# Patient Record
Sex: Female | Born: 1970 | Race: White | Hispanic: No | Marital: Married | Smoking: Current every day smoker
Health system: Southern US, Community
[De-identification: ages and names within clinical notes are randomized; demographics above are authoritative.]

## PROBLEM LIST (undated history)

## (undated) DIAGNOSIS — Z720 Tobacco use: Secondary | ICD-10-CM

## (undated) DIAGNOSIS — F419 Anxiety disorder, unspecified: Secondary | ICD-10-CM

## (undated) DIAGNOSIS — E119 Type 2 diabetes mellitus without complications: Secondary | ICD-10-CM

## (undated) DIAGNOSIS — E7800 Pure hypercholesterolemia, unspecified: Secondary | ICD-10-CM

## (undated) DIAGNOSIS — E039 Hypothyroidism, unspecified: Secondary | ICD-10-CM

## (undated) DIAGNOSIS — K5792 Diverticulitis of intestine, part unspecified, without perforation or abscess without bleeding: Secondary | ICD-10-CM

## (undated) DIAGNOSIS — K219 Gastro-esophageal reflux disease without esophagitis: Secondary | ICD-10-CM

## (undated) HISTORY — DX: Pure hypercholesterolemia, unspecified: E78.00

## (undated) HISTORY — DX: Diverticulitis of intestine, part unspecified, without perforation or abscess without bleeding: K57.92

---

## 2005-04-08 ENCOUNTER — Emergency Department (HOSPITAL_COMMUNITY): Admission: EM | Admit: 2005-04-08 | Discharge: 2005-04-08 | Payer: Self-pay | Admitting: Emergency Medicine

## 2005-07-21 ENCOUNTER — Emergency Department (HOSPITAL_COMMUNITY): Admission: EM | Admit: 2005-07-21 | Discharge: 2005-07-21 | Payer: Self-pay | Admitting: Emergency Medicine

## 2006-02-18 HISTORY — PX: CHOLECYSTECTOMY: SHX55

## 2008-01-07 ENCOUNTER — Emergency Department (HOSPITAL_COMMUNITY): Admission: EM | Admit: 2008-01-07 | Discharge: 2008-01-07 | Payer: Self-pay | Admitting: Emergency Medicine

## 2010-11-21 LAB — URINALYSIS, ROUTINE W REFLEX MICROSCOPIC
Ketones, ur: NEGATIVE
Nitrite: NEGATIVE
Protein, ur: NEGATIVE
pH: 5.5

## 2011-02-26 ENCOUNTER — Encounter: Payer: Self-pay | Admitting: *Deleted

## 2011-02-26 ENCOUNTER — Emergency Department (HOSPITAL_COMMUNITY)
Admission: EM | Admit: 2011-02-26 | Discharge: 2011-02-26 | Disposition: A | Discharge status: Home / Self Care | Payer: BC Managed Care – PPO | Attending: Emergency Medicine | Admitting: Emergency Medicine

## 2011-02-26 ENCOUNTER — Emergency Department (HOSPITAL_COMMUNITY): Payer: BC Managed Care – PPO

## 2011-02-26 DIAGNOSIS — H109 Unspecified conjunctivitis: Secondary | ICD-10-CM | POA: Insufficient documentation

## 2011-02-26 DIAGNOSIS — J40 Bronchitis, not specified as acute or chronic: Secondary | ICD-10-CM | POA: Insufficient documentation

## 2011-02-26 DIAGNOSIS — J9801 Acute bronchospasm: Secondary | ICD-10-CM

## 2011-02-26 DIAGNOSIS — F17200 Nicotine dependence, unspecified, uncomplicated: Secondary | ICD-10-CM | POA: Insufficient documentation

## 2011-02-26 MED ORDER — PREDNISONE 20 MG PO TABS
60.0000 mg | ORAL_TABLET | Freq: Once | ORAL | Status: AC
  Administered 2011-02-26: 60 mg via ORAL
  Filled 2011-02-26: qty 3

## 2011-02-26 MED ORDER — PREDNISONE 10 MG PO TABS: 20.0000 mg | ORAL_TABLET | Freq: Two times a day (BID) | ORAL | Status: DC

## 2011-02-26 MED ORDER — TOBRAMYCIN 0.3 % OP SOLN: 1.0000 [drp] | OPHTHALMIC | Status: AC

## 2011-02-26 MED ORDER — AZITHROMYCIN 250 MG PO TABS: 250.0000 mg | ORAL_TABLET | Freq: Every day | ORAL | Status: AC

## 2011-02-26 MED ORDER — ALBUTEROL SULFATE HFA 108 (90 BASE) MCG/ACT IN AERS
2.0000 | INHALATION_SPRAY | RESPIRATORY_TRACT | Status: DC | PRN
  Administered 2011-02-26: 2 via RESPIRATORY_TRACT
  Filled 2011-02-26: qty 6.7

## 2011-02-26 MED ORDER — AZITHROMYCIN 250 MG PO TABS
500.0000 mg | ORAL_TABLET | Freq: Once | ORAL | Status: AC
  Administered 2011-02-26: 500 mg via ORAL
  Filled 2011-02-26: qty 2

## 2011-02-26 NOTE — ED Notes (Signed)
Pt a/ox4. Resp even and unlabored. NAD at this time. D/C instructions reviewed with pt. Pt verbalized understanding. Pt ambulated to lobby with steady gate.  

## 2011-02-26 NOTE — ED Notes (Signed)
Cough, congestion for 1 week.  

## 2011-02-27 NOTE — ED Provider Notes (Signed)
History     CSN: [id number]  Arrival date & time 02/26/11  1910   First MD Initiated Contact with Patient 02/26/11 2057      Chief Complaint  Patient presents with  . Shortness of Breath    (Consider location/radiation/quality/duration/timing/severity/associated sxs/prior treatment) Patient is a 41 y.o. female presenting with shortness of breath. The history is provided by the patient (pt complians of cough). No language interpreter was used.  Shortness of Breath  The current episode started yesterday. The problem occurs rarely. The problem has been unchanged. The problem is mild. The symptoms are relieved by nothing. The symptoms are aggravated by nothing. Associated symptoms include chest pressure, orthopnea, cough and shortness of breath. Pertinent negatives include no chest pain. The cough has no precipitants. The cough is non-productive. Nothing relieves the cough. Nothing worsens the cough. Her past medical history does not include asthma.    History reviewed. No pertinent past medical history.  Past Surgical History  Procedure Date  . Cholecystectomy     History reviewed. No pertinent family history.  History  Substance Use Topics  . Smoking status: Current Everyday Smoker  . Smokeless tobacco: Not on file  . Alcohol Use: Yes    OB History    Grav Para Term Preterm Abortions TAB SAB Ect Mult Living                  Review of Systems  Constitutional: Negative for fatigue.  HENT: Negative for congestion, sinus pressure and ear discharge.   Eyes: Negative for discharge.  Respiratory: Positive for cough and shortness of breath.   Cardiovascular: Positive for orthopnea. Negative for chest pain.  Gastrointestinal: Negative for abdominal pain and diarrhea.  Genitourinary: Negative for frequency and hematuria.  Musculoskeletal: Negative for back pain.  Skin: Negative for rash.  Neurological: Negative for seizures and headaches.  Hematological: Negative.     Psychiatric/Behavioral: Negative for hallucinations.    Allergies  Review of patient's allergies indicates no known allergies.  Home Medications   Current Outpatient Rx  Name Route Sig Dispense Refill  . ALPRAZOLAM 1 MG PO TABS Oral Take 1 mg by mouth daily as needed. FOR ANXIETY     . GOODY HEADACHE PO Oral Take 1 packet by mouth as needed. For pain     . CLEAR EYES REDNESS RELIEF OP Ophthalmic Apply 1-2 drops to eye daily as needed. Redness in eye     . PHENYLEPHRINE-DM-GG 2.5-5-100 MG/5ML PO LIQD Oral Take 15 mLs by mouth every 4 (four) hours as needed. For congestion/wheezing     . AZITHROMYCIN 250 MG PO TABS Oral Take 1 tablet (250 mg total) by mouth daily. 4 tablet 0  . PREDNISONE 10 MG PO TABS Oral Take 2 tablets (20 mg total) by mouth 2 (two) times daily. 10 tablet 0  . TOBRAMYCIN SULFATE 0.3 % OP SOLN Right Eye Place 1 drop into the right eye every 4 (four) hours. 5 mL 0    BP 136/77  Pulse 89  Temp(Src) 98.5 F (36.9 C) (Oral)  Resp 20  Ht 5\' 8"  (1.727 m)  Wt 250 lb (113.399 kg)  BMI 38.01 kg/m2  SpO2 95%  LMP 02/07/2011  Physical Exam  Constitutional: She is oriented to person, place, and time. She appears well-developed.  HENT:  Head: Normocephalic and atraumatic.  Eyes: EOM are normal. No scleral icterus.       Right eye with small infection medially  Neck: Neck supple. No thyromegaly present.  Cardiovascular:  Normal rate and regular rhythm.  Exam reveals no gallop and no friction rub.   No murmur heard. Pulmonary/Chest: No stridor. She has wheezes. She has no rales. She exhibits no tenderness.  Abdominal: She exhibits no distension. There is no tenderness. There is no rebound.  Musculoskeletal: Normal range of motion. She exhibits no edema.  Lymphadenopathy:    She has no cervical adenopathy.  Neurological: She is oriented to person, place, and time. Coordination normal.  Skin: No rash noted. No erythema.  Psychiatric: She has a normal mood and affect.  Her behavior is normal.    ED Course  Procedures (including critical care time)  Labs Reviewed - No data to display Dg Chest 2 View  02/26/2011  *RADIOLOGY REPORT*  Clinical Data: Cough, short of breath, wheeze  CHEST - 2 VIEW  Comparison: None  Findings: Normal cardiac silhouette.  There is chronic bronchitic markings centrally.  No focal consolidation.  No pleural fluid. No acute osseous abnormality.  IMPRESSION: Chronic appearing bronchitic markings.  Cannot exclude  bronchitis. No evidence of pneumonia.  Original Report Authenticated By: Genevive Bi, M.D.     1. Bronchitis   2. Bronchospasm   3. Conjunctivitis       MDM          Benny Lennert, MD 02/27/11 1732

## 2012-12-02 ENCOUNTER — Inpatient Hospital Stay (HOSPITAL_COMMUNITY)
Admission: AD | Admit: 2012-12-02 | Discharge: 2012-12-07 | DRG: 392 | Disposition: A | Discharge status: Home / Self Care | Payer: BC Managed Care – PPO | Source: Ambulatory Visit | Attending: Family Medicine | Admitting: Family Medicine

## 2012-12-02 ENCOUNTER — Emergency Department (HOSPITAL_COMMUNITY)
Admission: EM | Admit: 2012-12-02 | Discharge: 2012-12-02 | Disposition: A | Discharge status: Home / Self Care | Payer: BC Managed Care – PPO | Source: Home / Self Care | Attending: Emergency Medicine | Admitting: Emergency Medicine

## 2012-12-02 ENCOUNTER — Encounter (HOSPITAL_COMMUNITY): Payer: Self-pay

## 2012-12-02 ENCOUNTER — Emergency Department (HOSPITAL_COMMUNITY): Payer: BC Managed Care – PPO

## 2012-12-02 ENCOUNTER — Encounter (HOSPITAL_COMMUNITY): Payer: Self-pay | Admitting: Emergency Medicine

## 2012-12-02 DIAGNOSIS — E039 Hypothyroidism, unspecified: Secondary | ICD-10-CM | POA: Diagnosis present

## 2012-12-02 DIAGNOSIS — K5720 Diverticulitis of large intestine with perforation and abscess without bleeding: Secondary | ICD-10-CM | POA: Diagnosis present

## 2012-12-02 DIAGNOSIS — E876 Hypokalemia: Secondary | ICD-10-CM

## 2012-12-02 DIAGNOSIS — D72829 Elevated white blood cell count, unspecified: Secondary | ICD-10-CM | POA: Diagnosis present

## 2012-12-02 DIAGNOSIS — K5732 Diverticulitis of large intestine without perforation or abscess without bleeding: Principal | ICD-10-CM

## 2012-12-02 DIAGNOSIS — E079 Disorder of thyroid, unspecified: Secondary | ICD-10-CM

## 2012-12-02 DIAGNOSIS — A419 Sepsis, unspecified organism: Secondary | ICD-10-CM | POA: Diagnosis present

## 2012-12-02 DIAGNOSIS — F17200 Nicotine dependence, unspecified, uncomplicated: Secondary | ICD-10-CM

## 2012-12-02 DIAGNOSIS — K5792 Diverticulitis of intestine, part unspecified, without perforation or abscess without bleeding: Secondary | ICD-10-CM | POA: Diagnosis present

## 2012-12-02 DIAGNOSIS — Z6838 Body mass index (BMI) 38.0-38.9, adult: Secondary | ICD-10-CM

## 2012-12-02 DIAGNOSIS — F419 Anxiety disorder, unspecified: Secondary | ICD-10-CM

## 2012-12-02 DIAGNOSIS — K5900 Constipation, unspecified: Secondary | ICD-10-CM | POA: Diagnosis present

## 2012-12-02 DIAGNOSIS — E669 Obesity, unspecified: Secondary | ICD-10-CM

## 2012-12-02 DIAGNOSIS — R739 Hyperglycemia, unspecified: Secondary | ICD-10-CM

## 2012-12-02 DIAGNOSIS — E871 Hypo-osmolality and hyponatremia: Secondary | ICD-10-CM | POA: Diagnosis present

## 2012-12-02 DIAGNOSIS — Z720 Tobacco use: Secondary | ICD-10-CM

## 2012-12-02 DIAGNOSIS — F411 Generalized anxiety disorder: Secondary | ICD-10-CM | POA: Diagnosis present

## 2012-12-02 DIAGNOSIS — R109 Unspecified abdominal pain: Secondary | ICD-10-CM

## 2012-12-02 HISTORY — DX: Tobacco use: Z72.0

## 2012-12-02 HISTORY — DX: Anxiety disorder, unspecified: F41.9

## 2012-12-02 HISTORY — DX: Hypothyroidism, unspecified: E03.9

## 2012-12-02 LAB — CBC WITH DIFFERENTIAL/PLATELET
Basophils Relative: 0 % (ref 0–1)
Eosinophils Absolute: 0.1 10*3/uL (ref 0.0–0.7)
Eosinophils Relative: 0 % (ref 0–5)
MCHC: 34.1 g/dL (ref 30.0–36.0)
MCV: 90.7 fL (ref 78.0–100.0)
Monocytes Absolute: 0.9 10*3/uL (ref 0.1–1.0)
Monocytes Relative: 6 % (ref 3–12)
Neutro Abs: 13.4 10*3/uL — ABNORMAL HIGH (ref 1.7–7.7)
Neutrophils Relative %: 88 % — ABNORMAL HIGH (ref 43–77)
Platelets: 204 10*3/uL (ref 150–400)
RBC: 4.52 MIL/uL (ref 3.87–5.11)
RDW: 13.8 % (ref 11.5–15.5)
WBC: 15.3 10*3/uL — ABNORMAL HIGH (ref 4.0–10.5)

## 2012-12-02 LAB — TSH: TSH: 11.47 u[IU]/mL — ABNORMAL HIGH (ref 0.350–4.500)

## 2012-12-02 LAB — PREGNANCY, URINE: Preg Test, Ur: NEGATIVE

## 2012-12-02 LAB — URINALYSIS, ROUTINE W REFLEX MICROSCOPIC
Hgb urine dipstick: NEGATIVE
Ketones, ur: NEGATIVE mg/dL
Nitrite: NEGATIVE
Specific Gravity, Urine: 1.02 (ref 1.005–1.030)
pH: 7 (ref 5.0–8.0)

## 2012-12-02 LAB — COMPREHENSIVE METABOLIC PANEL
AST: 15 U/L (ref 0–37)
Chloride: 100 mEq/L (ref 96–112)
Creatinine, Ser: 0.73 mg/dL (ref 0.50–1.10)
GFR calc Af Amer: >90 mL/min (ref >90)
GFR calc non Af Amer: >90 mL/min (ref >90)
Sodium: 134 mEq/L — ABNORMAL LOW (ref 135–145)
Total Bilirubin: 1 mg/dL (ref 0.3–1.2)

## 2012-12-02 LAB — PROTIME-INR
INR: 1.02 (ref 0.00–1.49)
Prothrombin Time: 13.2 seconds (ref 11.6–15.2)

## 2012-12-02 LAB — LIPASE, BLOOD: Lipase: 18 U/L (ref 11–59)

## 2012-12-02 LAB — HEMOGLOBIN A1C: Mean Plasma Glucose: 137 mg/dL — ABNORMAL HIGH (ref <117)

## 2012-12-02 MED ORDER — MORPHINE SULFATE 4 MG/ML IJ SOLN
4.0000 mg | Freq: Once | INTRAMUSCULAR | Status: AC
  Administered 2012-12-02: 4 mg via INTRAVENOUS
  Filled 2012-12-02: qty 1

## 2012-12-02 MED ORDER — ACETAMINOPHEN 650 MG RE SUPP: 650.0000 mg | Freq: Four times a day (QID) | RECTAL | Status: DC | PRN

## 2012-12-02 MED ORDER — ENOXAPARIN SODIUM 40 MG/0.4ML ~~LOC~~ SOLN
40.0000 mg | Freq: Every day | SUBCUTANEOUS | Status: DC
  Administered 2012-12-02 – 2012-12-06 (×5): 40 mg via SUBCUTANEOUS
  Filled 2012-12-02 (×5): qty 0.4

## 2012-12-02 MED ORDER — NICOTINE 21 MG/24HR TD PT24
21.0000 mg | MEDICATED_PATCH | Freq: Every day | TRANSDERMAL | Status: DC
  Administered 2012-12-02 – 2012-12-06 (×5): 21 mg via TRANSDERMAL
  Filled 2012-12-02 (×4): qty 1

## 2012-12-02 MED ORDER — ONDANSETRON HCL 4 MG PO TABS: 4.0000 mg | ORAL_TABLET | Freq: Four times a day (QID) | ORAL | Status: DC | PRN

## 2012-12-02 MED ORDER — SODIUM CHLORIDE 0.9 % IJ SOLN
INTRAMUSCULAR | Status: AC
  Administered 2012-12-02: 10 mL
  Filled 2012-12-02: qty 400

## 2012-12-02 MED ORDER — POTASSIUM CHLORIDE IN NACL 20-0.9 MEQ/L-% IV SOLN
INTRAVENOUS | Status: DC
  Administered 2012-12-02 – 2012-12-07 (×8): via INTRAVENOUS

## 2012-12-02 MED ORDER — METRONIDAZOLE IN NACL 5-0.79 MG/ML-% IV SOLN
500.0000 mg | Freq: Three times a day (TID) | INTRAVENOUS | Status: DC
  Administered 2012-12-02 – 2012-12-07 (×15): 500 mg via INTRAVENOUS
  Filled 2012-12-02 (×16): qty 100

## 2012-12-02 MED ORDER — ACETAMINOPHEN 325 MG PO TABS
650.0000 mg | ORAL_TABLET | Freq: Four times a day (QID) | ORAL | Status: DC | PRN
  Administered 2012-12-02: 650 mg via ORAL
  Filled 2012-12-02: qty 2

## 2012-12-02 MED ORDER — IOHEXOL 300 MG/ML  SOLN
50.0000 mL | Freq: Once | INTRAMUSCULAR | Status: AC | PRN
  Administered 2012-12-02: 50 mL via ORAL

## 2012-12-02 MED ORDER — ONDANSETRON HCL 4 MG/2ML IJ SOLN: 4.0000 mg | Freq: Four times a day (QID) | INTRAMUSCULAR | Status: DC | PRN

## 2012-12-02 MED ORDER — HYDROMORPHONE HCL PF 1 MG/ML IJ SOLN
1.0000 mg | INTRAMUSCULAR | Status: DC | PRN
  Administered 2012-12-02 – 2012-12-06 (×18): 1 mg via INTRAVENOUS
  Filled 2012-12-02 (×18): qty 1

## 2012-12-02 MED ORDER — HYDROCODONE-ACETAMINOPHEN 5-325 MG PO TABS: 1.0000 | ORAL_TABLET | Freq: Four times a day (QID) | ORAL | Status: DC | PRN

## 2012-12-02 MED ORDER — CIPROFLOXACIN IN D5W 400 MG/200ML IV SOLN
400.0000 mg | Freq: Two times a day (BID) | INTRAVENOUS | Status: DC
  Administered 2012-12-02 – 2012-12-06 (×10): 400 mg via INTRAVENOUS
  Filled 2012-12-02 (×11): qty 200

## 2012-12-02 MED ORDER — LORAZEPAM 2 MG/ML IJ SOLN
0.5000 mg | Freq: Four times a day (QID) | INTRAMUSCULAR | Status: DC | PRN
  Administered 2012-12-03 – 2012-12-06 (×7): 0.5 mg via INTRAVENOUS
  Filled 2012-12-02 (×7): qty 1

## 2012-12-02 MED ORDER — LEVOTHYROXINE SODIUM 100 MCG IV SOLR
44.0000 ug | Freq: Every day | INTRAVENOUS | Status: DC
  Filled 2012-12-02 (×2): qty 5

## 2012-12-02 MED ORDER — IOHEXOL 300 MG/ML  SOLN
100.0000 mL | Freq: Once | INTRAMUSCULAR | Status: AC | PRN
  Administered 2012-12-02: 100 mL via INTRAVENOUS

## 2012-12-02 NOTE — ED Notes (Signed)
Pt c/o pain in the lower abdomen x 3 days with N/V. Reports dysuria and constipation with last normal BM on Monday night.

## 2012-12-02 NOTE — Progress Notes (Signed)
Dr. Sherrie Mustache and Toya Smothers, NP notified of temp 102.4F New orders for blood cultures x2.

## 2012-12-02 NOTE — H&P (Signed)
[facility] History and Physical  ALYHA MARINES XBJ:[id number] DOB: 01/14/1971 DOA: 12/02/2012  Referring physician:  PCP: Milana Obey, MD  Specialists:   Chief Complaint:   HPI: [name] is a very pleasant 42 y.o. female past medical history that includes thyroid disease (hypothyroidism), anxiety, borderline impaired glucose tolerance presents to room 301 from her PCPs office this morning with the chief complaint of abdominal pain. Information is obtained from the patient and her husband who is at the bedside. She reports that 3 days ago she developed sudden sharp pain located in her suprapubic area of her abdomen. The pain radiates to the left lower quadrant greater than the right lower quadrant.. She states that she had a bowel movement that evening and has not had a significant bowel movement since then. She has continued to work and attempted to manage her pain with ibuprofen with little relief. Her symptoms came on suddenly and have persisted. The pain is characterized as moderate. Associated symptoms include nausea, vomiting and anorexia. She denies any coffee ground emesis and states emesis is mostly clear liquid. She also reports some burning with urination at the very beginning of flow. She denies any fever , chills, or recent sick contacts. She came to the emergency department very early this morning with the same complaint and was discharged home with pain medicine and instructions to followup with her PCP next week. When she got home it was quickly obvious to her that the pain was worsening as was the nausea. She went to Dr. Michelle Nasuti office this morning. Following his evaluation, she was referred for direct hospital admission. Initial workup while in the emergency department earlier this morning included a CT of the abdomen/pelvis which yielded acute diverticulitis of the mid sigmoid colon immediately above the bladder with a micro perforation. Lab work at that time was  significant for sodium of 134 , potassium 3.4, white count of 15.3 and glucose of 133.  [facility] were asked to admit   Review of Systems:  10 point review of systems completed all systems are negative except as indicated in the history of present illness  Past Medical History  Diagnosis Date  . Anxiety   . Hypothyroidism    Past Surgical History  Procedure Laterality Date  . Cholecystectomy     Social History:  reports that she has been smoking.  She does not have any smokeless tobacco history on file. She reports that she drinks alcohol. She reports that she does not use illicit drugs. She is married and lives with her husband she is employed full-time with [employer]. She smokes approximately a pack of cigarettes a day and has done so for the last 17 years. She denies EtOH or illicit drug use.  No Known Allergies  Family Hx:  mother 87 years old has a medical history that includes hypertension and GERD. Father is 83 years old, he has had prostate cancer and a history of diverticulitis. She has 2 siblings their collective medical history is positive for prostate cancer.   Prior to Admission medications   Medication Sig Start Date End Date Taking? Authorizing Provider  ALPRAZolam Prudy Feeler) 1 MG tablet Take 1 mg by mouth daily as needed. FOR ANXIETY    Yes Historical Provider, MD  levothyroxine (SYNTHROID, LEVOTHROID) 88 MCG tablet Take 88 mcg by mouth daily before breakfast.   Yes Historical Provider, MD  Aspirin-Acetaminophen-Caffeine (GOODY HEADACHE PO) Take 1 packet by mouth as needed. For pain     Historical  Provider, MD  HYDROcodone-acetaminophen (NORCO/VICODIN) 5-325 MG per tablet Take 1 tablet by mouth every 6 (six) hours as needed for pain. 12/02/12   Benny Lennert, MD  Naphazoline-Glycerin (CLEAR EYES REDNESS RELIEF OP) Apply 1-2 drops to eye daily as needed. Redness in eye     Historical Provider, MD  Phenylephrine-DM-GG (MUCINEX FAST-MAX CONGEST COUGH) 2.5-5-100 MG/5ML LIQD  Take 15 mLs by mouth every 4 (four) hours as needed. For congestion/wheezing     Historical Provider, MD  predniSONE (DELTASONE) 10 MG tablet Take 2 tablets (20 mg total) by mouth 2 (two) times daily. 02/26/11   Benny Lennert, MD   Physical Exam: Filed Vitals:   12/02/12 1309  BP: 100/51  Pulse: 88  Temp: 98.3 F (36.8 C)  Resp: 18     General:  patient is obese. She is somewhat uncomfortable.  Eyes: PE RR L., EOMI, no scleral icterus  ENT: Her ears are clear, nose without drainage. Oropharynx is without erythema or exudate. Mucous membranes of her mouth are moist and pink  Neck: Neck is supple without lymphadenopathy. She has full range of motion. No JVD  Cardiovascular: S1-S2, no murmur, no gallop, no rub. There is trace lower extremity edema. Pedal pulses are present and palpable  Respiratory: Normal effort. Respirations are clear bilaterally to auscultation. I hear no wheezes or rhonchi.  Abdomen: Obese but soft. Bowel sounds are active in all quadrants. There is moderate tenderness to palpation in the suprapubic area with radiation of pain on the left upper and left lower quadrants. . Other areas of the abdomen are nontender to palpation There is no rebounding or guarding.  Skin: Warm and dry;  no rashes or lesions  Musculoskeletal: She has good muscle tone. Joints are without erythema or swelling.  Psychiatric: She is calm and cooperative.  Neurologic: Cranial nerves II through XII are grossly intact. Her speech is clear and she has facial symmetry.  Labs on Admission:  Basic Metabolic Panel:  Recent Labs Lab 12/02/12 0530  NA 134*  K 3.4*  CL 100  CO2 22  GLUCOSE 133*  BUN 8  CREATININE 0.73  CALCIUM 9.7   Liver Function Tests:  Recent Labs Lab 12/02/12 0530  AST 15  ALT 11  ALKPHOS 81  BILITOT 1.0  PROT 7.6  ALBUMIN 3.8    Recent Labs Lab 12/02/12 1057  LIPASE 18   No results found for this basename: AMMONIA,  in the last 168  hours CBC:  Recent Labs Lab 12/02/12 0530  WBC 15.3*  NEUTROABS 13.4*  HGB 14.0  HCT 41.0  MCV 90.7  PLT 204   Cardiac Enzymes: No results found for this basename: CKTOTAL, CKMB, CKMBINDEX, TROPONINI,  in the last 168 hours  BNP (last 3 results) No results found for this basename: PROBNP,  in the last 8760 hours CBG: No results found for this basename: GLUCAP,  in the last 168 hours  Radiological Exams on Admission: Dg Abd 1 View  12/02/2012   CLINICAL DATA:  Abdominal pain. Emesis. Constipation.  EXAM: ABDOMEN - 1 VIEW  COMPARISON:  None.  FINDINGS: No evidence of bowel obstruction. Stool volume is within normal limits. There is high-density material throughout the fecal stream. When accounting for this, no abnormal intra-abdominal calcification is suspected. Cholecystectomy. No acute osseous findings.  IMPRESSION: Negative.   Electronically Signed   By: Tiburcio Pea M.D.   On: 12/02/2012 06:10   Ct Abdomen Pelvis W Contrast  12/02/2012   CLINICAL DATA:  Suprapubic pain.  EXAM: CT ABDOMEN AND PELVIS WITH CONTRAST  TECHNIQUE: Multidetector CT imaging of the abdomen and pelvis was performed using the standard protocol following bolus administration of intravenous contrast.  CONTRAST:  50mL OMNIPAQUE IOHEXOL 300 MG/ML SOLN, OMNIPAQUE IOHEXOL 300 MG/ML SOLN  COMPARISON:  Radiographs dated 12/02/2012  FINDINGS: Cholecystectomy. The liver, biliary tree, spleen, pancreas, adrenal glands, and kidneys are otherwise normal.  There is acute diverticulitis in the midline of the pelvis just above the bladder with pericolonic soft tissue stranding. There are few tiny bubbles of air along the posterior margin of the colon which may represent a micro perforation. No pericolonic abscess. The bowel is otherwise normal including the terminal ileum and appendix.  There are prominent low-density structures in both adnexa which may represent bilateral hydrosalpinges. Uterus appears normal.   IMPRESSION: Acute diverticulitis of the mid sigmoid colon immediately above the bladder with a micro perforation.  Tiny amount of free fluid in the pelvis. Possible bilateral hydrosalpinges.   Electronically Signed   By: Geanie Cooley M.D.   On: 12/02/2012 07:27    EKG: I  Assessment/Plan Principal Problem: Sepsis: Secondary to acute diverticulitis of the mid sigmoid colon with microperforation. She is hemodynamically stable, but her temperature has increased to 102.8. Blood cultures have been ordered. The patient is being admitted to a medical/surgical bed. Will start IV Cipro and Flagyl as well as IV fluids. There will be a low threshold for expanding antibiotic therapy. Will request a general surgical consult. Will keep the patient n.p.o. until evaluated by general surgery. We'll provide supportive treatment with IV analgesics and IV antiemetics as needed.    Acute diverticulitis: See problem #1. Patient continues with infrequent liquid stool, but denies outright diarrhea Will obtain C. difficile and GI pathogen panel for thoroughness.  Active Problems:   Leukocytosis, unspecified: Related to #1.   Mild Hyponatremia: Related to decreased by mouth intake over the last several days. We'll treat with normal saline-IV fluids. Will recheck in the a.m.    Mild Hypokalemia: Related to decreased by mouth intake. We'll add potassium to the IV fluids. Will recheck in the a.m.    Hyperglycemia: Patient reports some glucose intolerance per her PCP. Will monitor CBG and cover with insulin as indicated. Will check a hemoglobin A1c.    Obesity, unspecified: BMI 38.1. Will request nutritional consult once acute phase of illness is over.  Tobacco abuse. The patient was advised to stop smoking. We'll provide a nicotine patch.    Consults: General surgery pending.  Code Status: full Family: husband and mother at bedside Disposition Plan: home when ready Time spent: 59 minutes  Shabrea Weldin [facility] Pager [phone]  If 7PM-7AM, please contact night-coverage [email or web address] Password [facility] 12/02/2012, 1:33 PM     Attending: The patient was seen and examined. She was discussed with nurse practitioner, Ms. Vedia Coffer. The above note has been amended and/or annotated by me. Agree with the assessment and plan as noted. There will be a low threshold for expanding IV antibiotics. Of note, she spiked a fever prior to Cipro and Flagyl administration. General surgery consultation is pending. Per my assessment, she does not appear to need operative intervention at this time, but this will be deferred to general surgery. Will add a nicotine patch and order tobacco cessation counseling. Will increase the IV fluids from 75 cc to 125 cc an hour.

## 2012-12-02 NOTE — ED Notes (Signed)
MD at bedside. 

## 2012-12-02 NOTE — Consult Note (Signed)
Reason for Consult: Acute diverticulitis Referring Physician: Hospitalist  [name] is an 42 y.o. female.  HPI: Patient presented to [facility] with increasing suprapubic abdominal pain. This started approximately 3 days ago has slowly increased. She's had no similar symptomatology. She denies any bowel changes with no melena or hematochezia. No diarrhea or constipation. She does state she has had blood in her stools in the past but nothing recently. This also was noted not to have been worked up with any colonoscopy or visualization. She does have family history of family members with diverticulitis. She has had subjective fevers and chills. She's had associated nausea and emesis but no emesis since her arrival to the emergency department. Pain is constant and exacerbated with movement. Her appetite is diminished.  Past Medical History  Diagnosis Date  . Anxiety   . Hypothyroidism   . Tobacco abuse 12/02/2012    Past Surgical History  Procedure Laterality Date  . Cholecystectomy      No family history on file.  Social History:  reports that she has been smoking.  She does not have any smokeless tobacco history on file. She reports that she drinks alcohol. She reports that she does not use illicit drugs.  Allergies: No Known Allergies  Medications:  I have reviewed the patient's current medications. Prior to Admission:  Prescriptions prior to admission  Medication Sig Dispense Refill  . ALPRAZolam (XANAX) 1 MG tablet Take 1 mg by mouth daily as needed. FOR ANXIETY       . ALPRAZolam (XANAX) 1 MG tablet Take 0.5-1 mg by mouth 2 (two) times daily as needed for anxiety.      . Aspirin-Acetaminophen-Caffeine (GOODY HEADACHE PO) Take 1 packet by mouth as needed. For pain       . ibuprofen (ADVIL,MOTRIN) 800 MG tablet Take 800 mg by mouth every 8 (eight) hours as needed.      Marland Kitchen levothyroxine (SYNTHROID, LEVOTHROID) 88 MCG tablet Take 88 mcg by mouth daily before breakfast.       . zolpidem (AMBIEN) 10 MG tablet Take 10 mg by mouth at bedtime as needed for sleep.      Marland Kitchen HYDROcodone-acetaminophen (NORCO/VICODIN) 5-325 MG per tablet Take 1 tablet by mouth every 6 (six) hours as needed for pain.  20 tablet  0   Scheduled: . ciprofloxacin  400 mg Intravenous Q12H  . enoxaparin (LOVENOX) injection  40 mg Subcutaneous Daily  . [START ON 12/03/2012] levothyroxine  44 mcg Intravenous Daily  . metronidazole  500 mg Intravenous Q8H  . nicotine  21 mg Transdermal Daily   Continuous: . 0.9 % NaCl with KCl 20 mEq / L 125 mL/hr at 12/02/12 1350   ION:[id number], acetaminophen, HYDROmorphone (DILAUDID) injection, LORazepam, ondansetron (ZOFRAN) IV Anti-infectives   Start     Dose/Rate Route Frequency Ordered Stop   12/02/12 1200  ciprofloxacin (CIPRO) IVPB 400 mg     400 mg 200 mL/hr over 60 Minutes Intravenous Every 12 hours 12/02/12 1048     12/02/12 1200  metroNIDAZOLE (FLAGYL) IVPB 500 mg     500 mg 100 mL/hr over 60 Minutes Intravenous Every 8 hours 12/02/12 1048        Results for orders placed during the hospital encounter of 12/02/12 (from the past 48 hour(s))  PROTIME-INR     Status: None   Collection Time    12/02/12 10:39 AM      Result Value Range   Prothrombin Time 13.2  11.6 - 15.2 seconds  INR 1.02  0.00 - 1.49  LIPASE, BLOOD     Status: None   Collection Time    12/02/12 10:57 AM      Result Value Range   Lipase 18  11 - 59 U/L    Dg Abd 1 View  12/02/2012   CLINICAL DATA:  Abdominal pain. Emesis. Constipation.  EXAM: ABDOMEN - 1 VIEW  COMPARISON:  None.  FINDINGS: No evidence of bowel obstruction. Stool volume is within normal limits. There is high-density material throughout the fecal stream. When accounting for this, no abnormal intra-abdominal calcification is suspected. Cholecystectomy. No acute osseous findings.  IMPRESSION: Negative.   Electronically Signed   By: Tiburcio Pea M.D.   On: 12/02/2012 06:10   Ct Abdomen Pelvis W  Contrast  12/02/2012   CLINICAL DATA:  Suprapubic pain.  EXAM: CT ABDOMEN AND PELVIS WITH CONTRAST  TECHNIQUE: Multidetector CT imaging of the abdomen and pelvis was performed using the standard protocol following bolus administration of intravenous contrast.  CONTRAST:  50mL OMNIPAQUE IOHEXOL 300 MG/ML SOLN, OMNIPAQUE IOHEXOL 300 MG/ML SOLN  COMPARISON:  Radiographs dated 12/02/2012  FINDINGS: Cholecystectomy. The liver, biliary tree, spleen, pancreas, adrenal glands, and kidneys are otherwise normal.  There is acute diverticulitis in the midline of the pelvis just above the bladder with pericolonic soft tissue stranding. There are few tiny bubbles of air along the posterior margin of the colon which may represent a micro perforation. No pericolonic abscess. The bowel is otherwise normal including the terminal ileum and appendix.  There are prominent low-density structures in both adnexa which may represent bilateral hydrosalpinges. Uterus appears normal.  IMPRESSION: Acute diverticulitis of the mid sigmoid colon immediately above the bladder with a micro perforation.  Tiny amount of free fluid in the pelvis. Possible bilateral hydrosalpinges.   Electronically Signed   By: Geanie Cooley M.D.   On: 12/02/2012 07:27    Review of Systems  Constitutional: Positive for fever and chills.  HENT: Negative.   Eyes: Negative.   Respiratory: Negative.   Cardiovascular: Negative.   Gastrointestinal: Positive for nausea, vomiting and abdominal pain (suprapubic). Negative for diarrhea, constipation, blood in stool and melena.  Genitourinary: Negative.   Musculoskeletal: Negative.   Skin: Negative.   Neurological: Negative.   Endo/Heme/Allergies: Negative.   Psychiatric/Behavioral: Negative.    Blood pressure 100/51, pulse 88, temperature 98.3 F (36.8 C), temperature source Oral, resp. rate 18, height 5\' 8"  (1.727 m), weight 113.399 kg (250 lb), last menstrual period 11/25/2012, SpO2 96.00%. Physical  Exam  Constitutional: She appears well-developed and well-nourished. No distress.  Morbidly obese  HENT:  Head: Normocephalic and atraumatic.  Eyes: Conjunctivae and EOM are normal. Pupils are equal, round, and reactive to light. No scleral icterus.  Neck: Normal range of motion. Neck supple. No tracheal deviation present. No thyromegaly present.  Cardiovascular: Normal rate, regular rhythm and normal heart sounds.   Respiratory: Effort normal and breath sounds normal. No respiratory distress.  GI: Soft. She exhibits no distension and no mass. There is tenderness (Moderate suprapubic pain). There is no rebound and no guarding.  Lymphadenopathy:    She has no cervical adenopathy.  Skin: Skin is warm and dry.    Assessment/Plan: Acute diverticulitis. Microperforation. At this time patient will be continued in an n.p.o. status. Continue IV fluid hydration. Continue broad-spectrum antibiotic coverage. Continue DVT prophylaxis. Patient may have bathroom privileges. Indications to proceed to the operating room have been discussed at length with the patient and family. They  understand that should we need to proceed to the operating room a likely Hartmann's procedure will be performed. At this time I will continue to follow her course closely. Continue to monitor her WBC count and vitals closely. We'll continue to monitor pain direction.  Caron Tardif C 12/02/2012, 5:33 PM

## 2012-12-02 NOTE — ED Provider Notes (Signed)
CSN: [id number]     Arrival date & time 12/02/12  0506 History   First MD Initiated Contact with Patient 12/02/12 [phone]     Chief Complaint  Patient presents with  . Abdominal Pain  . Emesis  . Constipation   (Consider location/radiation/quality/duration/timing/severity/associated sxs/prior Treatment) HPI Comments:  Patient is a 42 year old female past medical history significant for thyroid disease and cholecystectomy. She presents today with complaints of pain in the suprapubic region of her abdomen she states has been ongoing for 3 days. It seems to be worsening. States she had a bowel movement 2 evenings ago however this did not relieve her symptoms. She feels constipated as if she has to go again. She reports some burning with urination but denies fevers or chills. Last menstrual period was last week and normal. She denies the possibility of pregnancy. She has no abnormal bleeding or discharge  Patient is a 42 y.o. female presenting with abdominal pain, vomiting, and constipation. The history is provided by the patient.  Abdominal Pain Pain location:  Suprapubic Pain quality: sharp   Pain radiates to:  Does not radiate Pain severity:  Moderate Onset quality:  Gradual Duration:  3 days Timing:  Constant Progression:  Worsening Chronicity:  New Relieved by:  Nothing Worsened by:  Nothing tried Ineffective treatments:  None tried Associated symptoms: constipation and vomiting   Emesis Associated symptoms: abdominal pain   Constipation Associated symptoms: abdominal pain and vomiting     Past Medical History  Diagnosis Date  . Thyroid disease    Past Surgical History  Procedure Laterality Date  . Cholecystectomy     No family history on file. History  Substance Use Topics  . Smoking status: Current Every Day Smoker  . Smokeless tobacco: Not on file  . Alcohol Use: Yes   OB History   Grav Para Term Preterm Abortions TAB SAB Ect Mult Living                 Review  of Systems  Gastrointestinal: Positive for vomiting, abdominal pain and constipation.  All other systems reviewed and are negative.    Allergies  Review of patient's allergies indicates no known allergies.  Home Medications   Current Outpatient Rx  Name  Route  Sig  Dispense  Refill  . ALPRAZolam (XANAX) 1 MG tablet   Oral   Take 1 mg by mouth daily as needed. FOR ANXIETY          . Aspirin-Acetaminophen-Caffeine (GOODY HEADACHE PO)   Oral   Take 1 packet by mouth as needed. For pain          . Naphazoline-Glycerin (CLEAR EYES REDNESS RELIEF OP)   Ophthalmic   Apply 1-2 drops to eye daily as needed. Redness in eye          . Phenylephrine-DM-GG (MUCINEX FAST-MAX CONGEST COUGH) 2.5-5-100 MG/5ML LIQD   Oral   Take 15 mLs by mouth every 4 (four) hours as needed. For congestion/wheezing          . predniSONE (DELTASONE) 10 MG tablet   Oral   Take 2 tablets (20 mg total) by mouth 2 (two) times daily.   10 tablet   0    BP 137/83  Pulse 110  Temp(Src) 99.5 F (37.5 C) (Oral)  Resp 24  Ht 5\' 8"  (1.727 m)  Wt 250 lb (113.399 kg)  BMI 38.02 kg/m2  SpO2 97%  LMP 11/25/2012 Physical Exam  Nursing note and vitals reviewed. Constitutional: She  is oriented to person, place, and time. She appears well-developed and well-nourished. No distress.  HENT:  Head: Normocephalic and atraumatic.  Neck: Normal range of motion. Neck supple.  Cardiovascular: Normal rate and regular rhythm.  Exam reveals no gallop and no friction rub.   No murmur heard. Pulmonary/Chest: Effort normal and breath sounds normal. No respiratory distress. She has no wheezes.  Abdominal: Soft. Bowel sounds are normal. She exhibits no distension. There is tenderness.  There is tenderness to palpation in the suprapubic region with no rebound or guarding. There is no right lower quadrant or left lower quadrant tenderness to palpation.  Musculoskeletal: Normal range of motion.  Neurological: She is alert  and oriented to person, place, and time.  Skin: Skin is warm and dry. She is not diaphoretic.    ED Course  Procedures (including critical care time) Labs Review Labs Reviewed  URINALYSIS, ROUTINE W REFLEX MICROSCOPIC  PREGNANCY, URINE  CBC WITH DIFFERENTIAL  COMPREHENSIVE METABOLIC PANEL   Imaging Review No results found.  EKG Interpretation   None       MDM  No diagnosis found. Patient is a 42 year old female presents with complaints of suprapubic discomfort for the past 3 days. She feels as though she is constipated although she had a normal bowel movement 2 evenings ago. She is tender to palpation in the suprapubic region but there is no rebound or guarding. Her urinalysis is unremarkable, however her white count is 15.3. This raises concerns for either diverticulitis or possibly an atypical appendicitis. I feel as though a CT scan is indicated to rule out these possibilities. She is currently drinking contrast and care will be signed out at shift change to the oncoming provider who will obtain the results of the CT scan and determine the final disposition.    Geoffery Lyons, MD 12/02/12 [phone]

## 2012-12-03 LAB — CBC
MCH: 30 pg (ref 26.0–34.0)
Platelets: 174 10*3/uL (ref 150–400)
RBC: 4.14 MIL/uL (ref 3.87–5.11)
RDW: 13.6 % (ref 11.5–15.5)
WBC: 12.1 10*3/uL — ABNORMAL HIGH (ref 4.0–10.5)

## 2012-12-03 LAB — BASIC METABOLIC PANEL
Calcium: 9 mg/dL (ref 8.4–10.5)
Creatinine, Ser: 0.75 mg/dL (ref 0.50–1.10)
GFR calc non Af Amer: >90 mL/min (ref >90)
Glucose, Bld: 100 mg/dL — ABNORMAL HIGH (ref 70–99)
Sodium: 137 mEq/L (ref 135–145)

## 2012-12-03 LAB — GLUCOSE, CAPILLARY: Glucose-Capillary: 90 mg/dL (ref 70–99)

## 2012-12-03 MED ORDER — LEVOTHYROXINE SODIUM 100 MCG IV SOLR
75.0000 ug | Freq: Every day | INTRAVENOUS | Status: DC
  Administered 2012-12-03 – 2012-12-06 (×4): 75 ug via INTRAVENOUS
  Filled 2012-12-03 (×5): qty 5

## 2012-12-03 NOTE — Care Management Note (Addendum)
    Page 1 of 1   12/07/2012     8:38:56 AM   CARE MANAGEMENT NOTE 12/07/2012  Patient:  [name], [name]   Account Number:  [id number]  Date Initiated:  12/03/2012  Documentation initiated by:  Sharrie Rothman  Subjective/Objective Assessment:   Pt admitted from home with diverticulitis. Pt lives with her husband and will return home at discharge. Pt is independent with ADL's.     Action/Plan:   No CM needs noted.   Anticipated DC Date:  12/03/2012   Anticipated DC Plan:  HOME/SELF CARE      DC Planning Services  CM consult      Choice offered to / List presented to:             Status of service:  Completed, signed off Medicare Important Message given?   (If response is "NO", the following Medicare IM given date fields will be blank) Date Medicare IM given:   Date Additional Medicare IM given:    Discharge Disposition:  HOME/SELF CARE  Per UR Regulation:    If discussed at Long Length of Stay Meetings, dates discussed:    Comments:  12/07/12 0840 Arlyss Queen, RN BSN CM Pt discharged home today. No CM needs noted.  12/03/12 1533 Arlyss Queen, RN BSN CM

## 2012-12-03 NOTE — Progress Notes (Signed)
UR chart review completed.  

## 2012-12-03 NOTE — Progress Notes (Signed)
[name] UJW:[id number] DOB: March 27, 1970 DOA: 12/02/2012 PCP: Milana Obey, MD   Subjective: This 42 year old lady was admitted yesterday with diverticulitis with microperforation. She continues to have lower abdominal pain. She has not had any nausea. She has not been febrile since admission. She's been seen by surgery, Dr. Leticia Penna who is going to follow her conservatively for the time being but the patient is aware that she might require surgery.           Physical Exam: Blood pressure 113/72, pulse 73, temperature 98 F (36.7 C), temperature source Oral, resp. rate 20, height 5\' 8"  (1.727 m), weight 113.399 kg (250 lb), last menstrual period 11/25/2012, SpO2 100.00%. She looks systemically well. Abdomen is soft and exquisitely tender in the lower abdomen more on the right than the left. Bowel sounds are not heard. There is no rigidity. Lung fields are clear. Heart sounds are present without tachycardia. She is not clinically septic. She is alert and orientated.   Investigations:  No results found for this or any previous visit (from the past 240 hour(s)).   Basic Metabolic Panel:  Recent Labs  56/21/30 0530 12/03/12 0442  NA 134* 137  K 3.4* 3.8  CL 100 103  CO2 22 25  GLUCOSE 133* 100*  BUN 8 7  CREATININE 0.73 0.75  CALCIUM 9.7 9.0   Liver Function Tests:  Recent Labs  12/02/12 0530  AST 15  ALT 11  ALKPHOS 81  BILITOT 1.0  PROT 7.6  ALBUMIN 3.8     CBC:  Recent Labs  12/02/12 0530 12/03/12 0442  WBC 15.3* 12.1*  NEUTROABS 13.4*  --   HGB 14.0 12.4  HCT 41.0 38.1  MCV 90.7 92.0  PLT 204 174    Dg Abd 1 View  12/02/2012   CLINICAL DATA:  Abdominal pain. Emesis. Constipation.  EXAM: ABDOMEN - 1 VIEW  COMPARISON:  None.  FINDINGS: No evidence of bowel obstruction. Stool volume is within normal limits. There is high-density material throughout the fecal stream. When accounting for this, no abnormal intra-abdominal calcification is  suspected. Cholecystectomy. No acute osseous findings.  IMPRESSION: Negative.   Electronically Signed   By: Tiburcio Pea M.D.   On: 12/02/2012 06:10   Ct Abdomen Pelvis W Contrast  12/02/2012   CLINICAL DATA:  Suprapubic pain.  EXAM: CT ABDOMEN AND PELVIS WITH CONTRAST  TECHNIQUE: Multidetector CT imaging of the abdomen and pelvis was performed using the standard protocol following bolus administration of intravenous contrast.  CONTRAST:  50mL OMNIPAQUE IOHEXOL 300 MG/ML SOLN, OMNIPAQUE IOHEXOL 300 MG/ML SOLN  COMPARISON:  Radiographs dated 12/02/2012  FINDINGS: Cholecystectomy. The liver, biliary tree, spleen, pancreas, adrenal glands, and kidneys are otherwise normal.  There is acute diverticulitis in the midline of the pelvis just above the bladder with pericolonic soft tissue stranding. There are few tiny bubbles of air along the posterior margin of the colon which may represent a micro perforation. No pericolonic abscess. The bowel is otherwise normal including the terminal ileum and appendix.  There are prominent low-density structures in both adnexa which may represent bilateral hydrosalpinges. Uterus appears normal.  IMPRESSION: Acute diverticulitis of the mid sigmoid colon immediately above the bladder with a micro perforation.  Tiny amount of free fluid in the pelvis. Possible bilateral hydrosalpinges.   Electronically Signed   By: Geanie Cooley M.D.   On: 12/02/2012 07:27      Medications: I have reviewed the patient's current medications.  Impression: 1. Acute diverticulitis with  microperforation. 2. Hypothyroidism. 3. Morbid obesity.     Plan: 1. Continue with intravenous ciprofloxacin and metronidazole. 2. Monitor clinically. I don't think she is quite ready to start any kind of oral intake today but I will defer to surgery on this. 3. Increase her intravenous thyroid replacement therapy to 75 mcg daily. 4. Out of bed into a chair.  Consultants: Surgery, Dr.  Leticia Penna.    Procedures:   none.   Antibiotics:   intravenous ciprofloxacin started 12/02/2012.  Intravenous metronidazole started 12/03/2038.                  Code Status:  full code.   Family Communication:  discuss plan with patient at the bedside.    Disposition Plan:  home when medically stable.  Time spent:  20 minutes.    LOS: 1 day   Wilson Singer Pager [phone]  12/03/2012, 8:39 AM

## 2012-12-04 LAB — CBC
HCT: 38.8 % (ref 36.0–46.0)
Hemoglobin: 12.7 g/dL (ref 12.0–15.0)
MCHC: 32.7 g/dL (ref 30.0–36.0)
MCV: 91.3 fL (ref 78.0–100.0)
Platelets: 202 10*3/uL (ref 150–400)
RDW: 13.6 % (ref 11.5–15.5)
WBC: 7.2 10*3/uL (ref 4.0–10.5)

## 2012-12-04 LAB — GLUCOSE, CAPILLARY: Glucose-Capillary: 75 mg/dL (ref 70–99)

## 2012-12-04 LAB — BASIC METABOLIC PANEL
BUN: 9 mg/dL (ref 6–23)
CO2: 22 mEq/L (ref 19–32)
Calcium: 8.9 mg/dL (ref 8.4–10.5)
Chloride: 104 mEq/L (ref 96–112)
Creatinine, Ser: 0.72 mg/dL (ref 0.50–1.10)
GFR calc Af Amer: >90 mL/min (ref >90)
GFR calc non Af Amer: >90 mL/min (ref >90)
Glucose, Bld: 86 mg/dL (ref 70–99)
Potassium: 3.7 mEq/L (ref 3.5–5.1)

## 2012-12-04 NOTE — Progress Notes (Signed)
  Subjective: Pain improved. No nausea. Positive bowel movement.  Objective: Vital signs in last 24 hours: Temp:  [97.7 F (36.5 C)-98.5 F (36.9 C)] 97.9 F (36.6 C) (10/17 0600) Pulse Rate:  [64-72] 65 (10/17 0600) Resp:  [18-20] 18 (10/17 0600) BP: (107-127)/(65-85) 127/85 mmHg (10/17 0600) SpO2:  [94 %-99 %] 99 % (10/17 0600) Last BM Date: 12/03/12  Intake/Output from previous day: 10/16 0701 - 10/17 0700 In: 3556.3 [I.V.:2856.3; IV Piggyback:700] Out: -  Intake/Output this shift:    General appearance: alert and no distress GI: Positive bowel sounds, soft, expected suprapubic tenderness. No diffuse peritoneal signs.  Lab Results:   Recent Labs  12/03/12 0442 12/04/12 0451  WBC 12.1* 7.2  HGB 12.4 12.7  HCT 38.1 38.8  PLT 174 202   BMET  Recent Labs  12/03/12 0442 12/04/12 0451  NA 137 138  K 3.8 3.7  CL 103 104  CO2 25 22  GLUCOSE 100* 86  BUN 7 9  CREATININE 0.75 0.72  CALCIUM 9.0 8.9   PT/INR  Recent Labs  12/02/12 1039  LABPROT 13.2  INR 1.02   ABG No results found for this basename: PHART, PCO2, PO2, HCO3,  in the last 72 hours  Studies/Results: No results found.  Anti-infectives: Anti-infectives   Start     Dose/Rate Route Frequency Ordered Stop   12/02/12 1200  ciprofloxacin (CIPRO) IVPB 400 mg     400 mg 200 mL/hr over 60 Minutes Intravenous Every 12 hours 12/02/12 1048     12/02/12 1200  metroNIDAZOLE (FLAGYL) IVPB 500 mg     500 mg 100 mL/hr over 60 Minutes Intravenous Every 8 hours 12/02/12 1048        Assessment/Plan: s/p * No surgery found * Acute diverticulitis. CBC Has improved. Continue IV antibiotics over the next 24 hours. May initiate clear liquids. Increase activity.  LOS: 2 days    Idamay Hosein C 12/04/2012

## 2012-12-04 NOTE — Progress Notes (Signed)
  Subjective: Pain slightly improved. No fevers or chills. Slight nausea. No bowel movement.  Objective: Vital signs in last 24 hours: Temp:  [97.7 F (36.5 C)-98.5 F (36.9 C)] 97.9 F (36.6 C) (10/17 0600) Pulse Rate:  [64-72] 65 (10/17 0600) Resp:  [18-20] 18 (10/17 0600) BP: (107-127)/(65-85) 127/85 mmHg (10/17 0600) SpO2:  [94 %-99 %] 99 % (10/17 0600) Last BM Date: 12/03/12  Intake/Output from previous day: 10/16 0701 - 10/17 0700 In: 3556.3 [I.V.:2856.3; IV Piggyback:700] Out: -  Intake/Output this shift:    General appearance: alert and no distress GI: Intermittent bowel sounds, soft, moderate distention, moderate suprapubic tenderness. No diffuse peritoneal signs.  Lab Results:   Recent Labs  12/03/12 0442 12/04/12 0451  WBC 12.1* 7.2  HGB 12.4 12.7  HCT 38.1 38.8  PLT 174 202   BMET  Recent Labs  12/03/12 0442 12/04/12 0451  NA 137 138  K 3.8 3.7  CL 103 104  CO2 25 22  GLUCOSE 100* 86  BUN 7 9  CREATININE 0.75 0.72  CALCIUM 9.0 8.9   PT/INR  Recent Labs  12/02/12 1039  LABPROT 13.2  INR 1.02   ABG No results found for this basename: PHART, PCO2, PO2, HCO3,  in the last 72 hours  Studies/Results: No results found.  Anti-infectives: Anti-infectives   Start     Dose/Rate Route Frequency Ordered Stop   12/02/12 1200  ciprofloxacin (CIPRO) IVPB 400 mg     400 mg 200 mL/hr over 60 Minutes Intravenous Every 12 hours 12/02/12 1048     12/02/12 1200  metroNIDAZOLE (FLAGYL) IVPB 500 mg     500 mg 100 mL/hr over 60 Minutes Intravenous Every 8 hours 12/02/12 1048        Assessment/Plan: s/p * No surgery found * Acute diverticulitis. Continue IV antibiotics. Continue bowel rest the patient may have some ice chips. Continue to monitor closely. Again discussed surgical indications the patient and family.  LOS: 2 days    [name] 12/04/2012

## 2012-12-04 NOTE — Progress Notes (Signed)
NAMECARLON, [name] NO.:  [id number]  MEDICAL RECORD NO.:  [id number]  LOCATION:  A301                          FACILITY:  [facility]  PHYSICIAN:  Mila Homer. Sudie Bailey, M.D.DATE OF BIRTH:  [birth year]  DATE OF PROCEDURE: DATE OF DISCHARGE:                                PROGRESS NOTE   SUBJECTIVE:  This 42 year old was admitted to the hospital 2 days ago with diverticulitis and a microperf.  She feels much better today.  She is still having abdominal pain, but this is much improved.  The surgeon has seen her today and clear liquids have been started.  OBJECTIVE:  VITAL SIGNS:  Temperature 97.9, pulse 65, respiratory rate 18, blood pressure 127/85. GENERAL:  She is sitting up in bed.  Her color is good.  Speech is normal.  She is nontoxic.  Her husband is in the room with her. HEART:  Her heart has a regular rhythm.  Rate of 70. LUNGS:  Clear throughout.  She is moving air well. ABDOMEN:  Her abdomen is still somewhat tender throughout but much improved compared to 2 days.  LABORATORY DATA:  White cell count today is 7200, down from 12,100 yesterday and 15,300 the day before.  Her electrolytes are normal.  ASSESSMENT: 1. Diverticulitis with a microperf. 2. Change in bowel habits.  PLAN:  She will be on IV Cipro and metronidazole for another day or two. Hopefully, she will be able to be discharged home when her abdominal pain has cleared.  I am concerned that she has had peritonitis with this, of course.  Because of the change in the bowel habit, blood in the bowel with flattening of her stool over the last several months, I am concerned that she might have a colonic malignancy.  Within 8 weeks after discharge, she should have a colonoscopy.  I discussed all this with the patient and her husband.     Mila Homer. Sudie Bailey, M.D.     SDK/MEDQ  D:  12/04/2012  T:  12/04/2012  Job:  [id number]

## 2012-12-05 NOTE — Progress Notes (Signed)
  Subjective: Abdominal pain has decreased. No nausea or vomiting. Has had bowel movement. Tolerating clear liquid diet well.  Objective: Vital signs in last 24 hours: Temp:  [97.8 F (36.6 C)-98.4 F (36.9 C)] 98.4 F (36.9 C) (10/18 0432) Pulse Rate:  [69-71] 69 (10/18 0432) Resp:  [18-19] 18 (10/18 0432) BP: (119-137)/(82-94) 130/82 mmHg (10/18 0432) SpO2:  [97 %-98 %] 97 % (10/18 0432) Last BM Date: 12/04/12  Intake/Output from previous day: 10/17 0701 - 10/18 0700 In: 6165 [P.O.:2465; I.V.:3000; IV Piggyback:700] Out: -  Intake/Output this shift:    General appearance: alert, cooperative and no distress GI: Soft with tenderness and fullness in the suprapubic region. No rigidity noted.  Lab Results:   Recent Labs  12/03/12 0442 12/04/12 0451  WBC 12.1* 7.2  HGB 12.4 12.7  HCT 38.1 38.8  PLT 174 202   BMET  Recent Labs  12/03/12 0442 12/04/12 0451  NA 137 138  K 3.8 3.7  CL 103 104  CO2 25 22  GLUCOSE 100* 86  BUN 7 9  CREATININE 0.75 0.72  CALCIUM 9.0 8.9   PT/INR  Recent Labs  12/02/12 1039  LABPROT 13.2  INR 1.02    Studies/Results: No results found.  Anti-infectives: Anti-infectives   Start     Dose/Rate Route Frequency Ordered Stop   12/02/12 1200  ciprofloxacin (CIPRO) IVPB 400 mg     400 mg 200 mL/hr over 60 Minutes Intravenous Every 12 hours 12/02/12 1048     12/02/12 1200  metroNIDAZOLE (FLAGYL) IVPB 500 mg     500 mg 100 mL/hr over 60 Minutes Intravenous Every 8 hours 12/02/12 1048        Assessment/Plan:  impression: Sigmoid colon diverticulitis with microperforation. Is recovering well with conservative measures. No need for acute surgical intervention. Leukocytosis resolved. Plan: We'll advance to full liquid diet. We'll encourage ambulation. Continue to monitor.  LOS: 3 days    Georgeana Oertel A 12/05/2012

## 2012-12-05 NOTE — Progress Notes (Signed)
[name], [name] NO.:  [id number]  MEDICAL RECORD NO.:  [id number]  LOCATION:  A301                          FACILITY:  [facility]  PHYSICIAN:  Melvyn Novas, MDDATE OF BIRTH:  Sep 21, 1970  DATE OF PROCEDURE: DATE OF DISCHARGE:                                PROGRESS NOTE   A 42 year old female with sigmoid diverticulitis, question of microperforation and some peritoneal inflammation.  The patient seen by Surgery, no surgical intervention warranted at present, currently controlled on Flagyl and Cipro IV advancing to soft diet and tolerating it well.  PHYSICAL EXAMINATION:  VITAL SIGNS:  Blood pressure 130/82, temperature 98.4, pulse 69 and regular, respiratory rate is 18. LUNGS:  Clear to A and P.  No rales, wheeze, or rhonchi. HEART:  Regular rhythm.  No murmurs, gallops, or rubs. ABDOMEN:  There is some mild diffuse abdominal tenderness, question of rebound and guarding.  No masses detectable.  WBC is down from 12 to 7.2, hemoglobin 12.7, creatinine 0.72.  PLAN:  Right now is to continue dual antibiotics, IV Cipro and Flagyl. Monitor clinically.  Patient encouraged to ambulate and to take opioid analgesia to prevent DVT.  We will check CBC in a.m.     Melvyn Novas, MD     RMD/MEDQ  D:  12/05/2012  T:  12/05/2012  Job:  [id number]

## 2012-12-05 NOTE — Progress Notes (Signed)
[id number] 

## 2012-12-06 LAB — BASIC METABOLIC PANEL
CO2: 26 mEq/L (ref 19–32)
Calcium: 8.8 mg/dL (ref 8.4–10.5)
Chloride: 101 mEq/L (ref 96–112)
Creatinine, Ser: 0.75 mg/dL (ref 0.50–1.10)
GFR calc Af Amer: >90 mL/min (ref >90)
GFR calc non Af Amer: >90 mL/min (ref >90)
Potassium: 3.7 mEq/L (ref 3.5–5.1)
Sodium: 137 mEq/L (ref 135–145)

## 2012-12-06 LAB — CBC
Hemoglobin: 12.8 g/dL (ref 12.0–15.0)
MCH: 29.7 pg (ref 26.0–34.0)
MCHC: 32.7 g/dL (ref 30.0–36.0)
MCV: 90.7 fL (ref 78.0–100.0)
Platelets: 252 10*3/uL (ref 150–400)
RBC: 4.31 MIL/uL (ref 3.87–5.11)
RDW: 13.4 % (ref 11.5–15.5)

## 2012-12-06 LAB — GLUCOSE, CAPILLARY: Glucose-Capillary: 93 mg/dL (ref 70–99)

## 2012-12-06 NOTE — Progress Notes (Signed)
[id number] 

## 2012-12-06 NOTE — Progress Notes (Signed)
[name], [name] NO.:  [id number]  MEDICAL RECORD NO.:  [id number]  LOCATION:  A301                          FACILITY:  [facility]  PHYSICIAN:  Melvyn Novas, MDDATE OF BIRTH:  09-29-1970  DATE OF PROCEDURE: DATE OF DISCHARGE:                                PROGRESS NOTE   SUBJECTIVE:  The patient has diverticulitis with some peritoneal signs, now resolving.  Her blood pressure is good.  She is eating a soft diet.  OBJECTIVE:  VITAL SIGNS:  Blood pressure 128/70, temperature 98.6, pulse 68 and regular, respiratory rate is 18, currently controlled on Flagyl and Cipro IV.  White count is 8.7, hemoglobin 12.8.  No evidence of bleed.  Creatinine 0.75. LUNGS:  Clear.  No rales, wheeze, or rhonchi. HEART:  Regular rhythm.  No murmurs, gallops, or rubs. GI:  She has some mild suprapubic tenderness.  No guarding, rebound, mass, or megaly.  Belly exam has somewhat improved over yesterday.  PLAN:  Continue soft diet.  The patient is encouraged to ambulate.  She understands this and will check her tolerance of food in a.m.     Melvyn Novas, MD     RMD/MEDQ  D:  12/06/2012  T:  12/06/2012  Job:  [id number]

## 2012-12-06 NOTE — Progress Notes (Signed)
  Subjective: More suprapubic pain over last 24 hours. Is controlled with pain medication.  Objective: Vital signs in last 24 hours: Temp:  [98.2 F (36.8 C)-98.6 F (37 C)] 98.6 F (37 C) (10/19 0540) Pulse Rate:  [68-74] 68 (10/19 0540) Resp:  [18] 18 (10/19 0540) BP: (126-134)/(69-83) 128/70 mmHg (10/19 0540) SpO2:  [100 %] 100 % (10/19 0540) Last BM Date: 12/05/12  Intake/Output from previous day: 10/18 0701 - 10/19 0700 In: 2345 [P.O.:460; I.V.:1885] Out: -  Intake/Output this shift:    General appearance: alert, cooperative and Anxious GI: Soft, firm is noted in the suprapubic region. It is tenderness region. There is no rigidity. No significant change from yesterday.  Lab Results:   Recent Labs  12/04/12 0451 12/06/12 0542  WBC 7.2 8.7  HGB 12.7 12.8  HCT 38.8 39.1  PLT 202 252   BMET  Recent Labs  12/04/12 0451 12/06/12 0542  NA 138 137  K 3.7 3.7  CL 104 101  CO2 22 26  GLUCOSE 86 96  BUN 9 4*  CREATININE 0.72 0.75  CALCIUM 8.9 8.8   PT/INR No results found for this basename: LABPROT, INR,  in the last 72 hours  Studies/Results: No results found.  Anti-infectives: Anti-infectives   Start     Dose/Rate Route Frequency Ordered Stop   12/02/12 1200  ciprofloxacin (CIPRO) IVPB 400 mg     400 mg 200 mL/hr over 60 Minutes Intravenous Every 12 hours 12/02/12 1048     12/02/12 1200  metroNIDAZOLE (FLAGYL) IVPB 500 mg     500 mg 100 mL/hr over 60 Minutes Intravenous Every 8 hours 12/02/12 1048        Assessment/Plan: Impression: Sigmoid diverticulitis. White blood cell count remains within normal limits. She will have intermittent episodes of abdominal pain. There is no indication for surgery at this time. This was explained to the patient and husband, who understand and agree. Continue current IV antibiotics.  LOS: 4 days    Leslee Haueter A 12/06/2012

## 2012-12-07 LAB — CULTURE, BLOOD (ROUTINE X 2)
Culture: NO GROWTH
Culture: NO GROWTH

## 2012-12-07 LAB — GLUCOSE, CAPILLARY: Glucose-Capillary: 86 mg/dL (ref 70–99)

## 2012-12-07 MED ORDER — HYDROCODONE-ACETAMINOPHEN 5-325 MG PO TABS: 1.0000 | ORAL_TABLET | Freq: Four times a day (QID) | ORAL | Status: DC | PRN

## 2012-12-07 MED ORDER — METRONIDAZOLE 500 MG PO TABS: 500.0000 mg | ORAL_TABLET | Freq: Three times a day (TID) | ORAL | Status: DC

## 2012-12-07 MED ORDER — CIPROFLOXACIN HCL 500 MG PO TABS: 500.0000 mg | ORAL_TABLET | Freq: Two times a day (BID) | ORAL | Status: DC

## 2012-12-07 NOTE — Progress Notes (Signed)
Patient states understanding of discharge instructions, prescriptions given 

## 2012-12-07 NOTE — Progress Notes (Signed)
Patient seen. She states her abdominal pain is much improved. She is being discharged home. Would recommend colonoscopy in next 6-8 weeks. Would continue oral antibiotics for treatment of sigmoid diverticulitis.

## 2012-12-07 NOTE — Discharge Summary (Signed)
Physician Discharge Summary  Patient ID: [name] MRN: [id number] DOB/AGE: [id number] y.o. Primary Care Physician:KNOWLTON,STEPHEN D, MD Admit date: 12/02/2012 Discharge date: 12/07/2012    Discharge Diagnoses:  1. Acute diverticulitis with microperforation. No surgery indicated. Clinically improved. 2. Hypothyroidism. 3. Morbid obesity.     Medication List    STOP taking these medications       GOODY HEADACHE PO     ibuprofen 800 MG tablet  Commonly known as:  ADVIL,MOTRIN      TAKE these medications       ALPRAZolam 1 MG tablet  Commonly known as:  XANAX  Take 1 mg by mouth daily as needed. FOR ANXIETY     ALPRAZolam 1 MG tablet  Commonly known as:  XANAX  Take 0.5-1 mg by mouth 2 (two) times daily as needed for anxiety.     ciprofloxacin 500 MG tablet  Commonly known as:  CIPRO  Take 1 tablet (500 mg total) by mouth 2 (two) times daily.     HYDROcodone-acetaminophen 5-325 MG per tablet  Commonly known as:  NORCO/VICODIN  Take 1 tablet by mouth every 6 (six) hours as needed for pain.     levothyroxine 88 MCG tablet  Commonly known as:  SYNTHROID, LEVOTHROID  Take 88 mcg by mouth daily before breakfast.     metroNIDAZOLE 500 MG tablet  Commonly known as:  FLAGYL  Take 1 tablet (500 mg total) by mouth 3 (three) times daily.     zolpidem 10 MG tablet  Commonly known as:  AMBIEN  Take 10 mg by mouth at bedtime as needed for sleep.        Discharged Condition: Stable and improving.    Consults: Surgery, Dr. Lovell Sheehan.  Significant Diagnostic Studies: Dg Abd 1 View  12/02/2012   CLINICAL DATA:  Abdominal pain. Emesis. Constipation.  EXAM: ABDOMEN - 1 VIEW  COMPARISON:  None.  FINDINGS: No evidence of bowel obstruction. Stool volume is within normal limits. There is high-density material throughout the fecal stream. When accounting for this, no abnormal intra-abdominal calcification is suspected. Cholecystectomy. No acute osseous findings.   IMPRESSION: Negative.   Electronically Signed   By: Tiburcio Pea M.D.   On: 12/02/2012 06:10   Ct Abdomen Pelvis W Contrast  12/02/2012   CLINICAL DATA:  Suprapubic pain.  EXAM: CT ABDOMEN AND PELVIS WITH CONTRAST  TECHNIQUE: Multidetector CT imaging of the abdomen and pelvis was performed using the standard protocol following bolus administration of intravenous contrast.  CONTRAST:  50mL OMNIPAQUE IOHEXOL 300 MG/ML SOLN, OMNIPAQUE IOHEXOL 300 MG/ML SOLN  COMPARISON:  Radiographs dated 12/02/2012  FINDINGS: Cholecystectomy. The liver, biliary tree, spleen, pancreas, adrenal glands, and kidneys are otherwise normal.  There is acute diverticulitis in the midline of the pelvis just above the bladder with pericolonic soft tissue stranding. There are few tiny bubbles of air along the posterior margin of the colon which may represent a micro perforation. No pericolonic abscess. The bowel is otherwise normal including the terminal ileum and appendix.  There are prominent Benitez-density structures in both adnexa which may represent bilateral hydrosalpinges. Uterus appears normal.  IMPRESSION: Acute diverticulitis of the mid sigmoid colon immediately above the bladder with a micro perforation.  Tiny amount of free fluid in the pelvis. Possible bilateral hydrosalpinges.   Electronically Signed   By: Geanie Cooley M.D.   On: 12/02/2012 07:27    Lab Results: Basic Metabolic Panel:  Recent Labs  78/29/56 0542  NA 137  K 3.7  CL 101  CO2 26  GLUCOSE 96  BUN 4*  CREATININE 0.75  CALCIUM 8.8       CBC:  Recent Labs  12/06/12 0542  WBC 8.7  HGB 12.8  HCT 39.1  MCV 90.7  PLT 252    Recent Results (from the past 240 hour(s))  CULTURE, BLOOD (ROUTINE X 2)     Status: None   Collection Time    12/02/12  1:15 PM      Result Value Range Status   Specimen Description BLOOD RIGHT ANTECUBITAL   Final   Special Requests     Final   Value: BOTTLES DRAWN AEROBIC AND ANAEROBIC AEB=12CC ANA=10CC    Culture NO GROWTH 4 DAYS   Final   Report Status PENDING   Incomplete  CULTURE, BLOOD (ROUTINE X 2)     Status: None   Collection Time    12/02/12  1:18 PM      Result Value Range Status   Specimen Description BLOOD LEFT HAND   Final   Special Requests     Final   Value: BOTTLES DRAWN AEROBIC AND ANAEROBIC AEB=8CC ANA=6CC   Culture NO GROWTH 4 DAYS   Final   Report Status PENDING   Incomplete     Hospital Course: This very pleasant 42 year old lady presents to the hospital with symptoms of abdominal pain. Please see initial history as outlined below: HPI: [name] is a very pleasant 42 y.o. female past medical history that includes thyroid disease (hypothyroidism), anxiety, borderline impaired glucose tolerance presents to room 301 from her PCPs office this morning with the chief complaint of abdominal pain. Information is obtained from the patient and her husband who is at the bedside. She reports that 3 days ago she developed sudden sharp pain located in her suprapubic area of her abdomen. The pain radiates to the left lower quadrant greater than the right lower quadrant.. She states that she had a bowel movement that evening and has not had a significant bowel movement since then. She has continued to work and attempted to manage her pain with ibuprofen with little relief. Her symptoms came on suddenly and have persisted. The pain is characterized as moderate. Associated symptoms include nausea, vomiting and anorexia. She denies any coffee ground emesis and states emesis is mostly clear liquid. She also reports some burning with urination at the very beginning of flow. She denies any fever , chills, or recent sick contacts. She came to the emergency department very early this morning with the same complaint and was discharged home with pain medicine and instructions to followup with her PCP next week. When she got home it was quickly obvious to her that the pain was worsening as was the  nausea. She went to Dr. Michelle Nasuti office this morning. Following his evaluation, she was referred for direct hospital admission. Initial workup while in the emergency department earlier this morning included a CT of the abdomen/pelvis which yielded acute diverticulitis of the mid sigmoid colon immediately above the bladder with a micro perforation. Lab work at that time was significant for sodium of 134 , potassium 3.4, white count of 15.3 and glucose of 133. [facility] were asked to admit  Discharge Exam: Blood pressure 118/74, pulse 74, temperature 98.6 F (37 C), temperature source Oral, resp. rate 20, height 5\' 8"  (1.727 m), weight 113.399 kg (250 lb), last menstrual period 11/25/2012, SpO2 97.00%. she looks systemically well. Her abdomen is soft and very mildly  tender now. Lung fields are clear. She is alert and orientated. The patient was admitted and CT scan confirmed the presence of acute diverticulitis with microperforation. Patient was started on intravenous ciprofloxacin and metronidazole. Surgery was consulted and they felt that they could follow this patient conservatively. The patient was initially n.p.o. and then gradually has progressed to a soft diet which she tolerated well. Her abdominal pain has improved significantly from when I last saw her. She has been afebrile. She is keen to go home and I agree with this providing she tolerates a regular diet this morning. I have instructed the nurse regarding this and I remain confident that she will be able to tolerate a regular diet. She will have a further one week course of oral antibiotics. She'll be reviewed in the office in about a week's time.  Disposition: Home.      Discharge Orders   Future Orders Complete By Expires   Diet - Benitez sodium heart healthy  As directed    Increase activity slowly  As directed       Follow-up Information   Follow up with Milana Obey, MD. Schedule an appointment as soon as possible for  a visit in 1 week.   Specialty:  Family Medicine   Contact information:   [address] [place] [address] [phone]       Signed: Wilson Singer Pager [phone]  12/07/2012, 8:24 AM

## 2012-12-07 NOTE — Progress Notes (Signed)
UR chart review completed.  

## 2012-12-30 ENCOUNTER — Other Ambulatory Visit (HOSPITAL_COMMUNITY): Payer: Self-pay | Admitting: Family Medicine

## 2012-12-30 ENCOUNTER — Ambulatory Visit (HOSPITAL_COMMUNITY)
Admission: RE | Admit: 2012-12-30 | Discharge: 2012-12-30 | Disposition: A | Discharge status: Home / Self Care | Payer: BC Managed Care – PPO | Source: Ambulatory Visit | Attending: Family Medicine | Admitting: Family Medicine

## 2012-12-30 DIAGNOSIS — K5790 Diverticulosis of intestine, part unspecified, without perforation or abscess without bleeding: Secondary | ICD-10-CM

## 2012-12-30 DIAGNOSIS — K5732 Diverticulitis of large intestine without perforation or abscess without bleeding: Secondary | ICD-10-CM | POA: Insufficient documentation

## 2012-12-30 DIAGNOSIS — R109 Unspecified abdominal pain: Secondary | ICD-10-CM | POA: Insufficient documentation

## 2012-12-30 MED ORDER — IOHEXOL 300 MG/ML  SOLN
100.0000 mL | Freq: Once | INTRAMUSCULAR | Status: AC | PRN
  Administered 2012-12-30: 100 mL via INTRAVENOUS

## 2012-12-31 ENCOUNTER — Encounter (INDEPENDENT_AMBULATORY_CARE_PROVIDER_SITE_OTHER): Payer: Self-pay | Admitting: *Deleted

## 2013-01-05 ENCOUNTER — Ambulatory Visit (INDEPENDENT_AMBULATORY_CARE_PROVIDER_SITE_OTHER): Payer: BC Managed Care – PPO | Admitting: Internal Medicine

## 2013-01-05 ENCOUNTER — Encounter (INDEPENDENT_AMBULATORY_CARE_PROVIDER_SITE_OTHER): Payer: Self-pay | Admitting: Internal Medicine

## 2013-01-05 VITALS — BP 136/88 | HR 76 | Temp 98.6°F | Resp 18 | Ht 67.0 in | Wt 271.2 lb

## 2013-01-05 DIAGNOSIS — K5732 Diverticulitis of large intestine without perforation or abscess without bleeding: Secondary | ICD-10-CM

## 2013-01-05 DIAGNOSIS — R1032 Left lower quadrant pain: Secondary | ICD-10-CM

## 2013-01-05 MED ORDER — METRONIDAZOLE 500 MG PO TABS: 500.0000 mg | ORAL_TABLET | Freq: Two times a day (BID) | ORAL | Status: DC

## 2013-01-05 MED ORDER — DICYCLOMINE HCL 10 MG PO CAPS: 10.0000 mg | ORAL_CAPSULE | Freq: Three times a day (TID) | ORAL | Status: AC

## 2013-01-05 NOTE — Patient Instructions (Signed)
Notify if she of temp greater than 100 F. Can take stool softener 2 capsules daily or MiraLax if you become constipated. Physician will call you with results of urinalysis.

## 2013-01-05 NOTE — Progress Notes (Signed)
Presenting complaint;  Recent bout of diverticulitis and persistent symptoms.  History of present illness;  Patient is 42 year old Caucasian female who is referred for courtesy of Dr. John Giovanni for GI evaluation. She is accompanied by her mother today. She was in usual state of health until 11/30/2012 when she developed sudden onset of hypogastric pain around 7 PM along with chills. She worked all night. She came home took Xanax and went to bed. When she woke up she was feeling better however by evening pain was back and increase in severity to the point of being unbearable. She was evaluated in emergency room on 12/02/2012. She underwent abdominopelvic CT revealing changes of sigmoid diverticulitis and microperforation. She she also had temp of 103 F. she was discharged in went directly to Dr. Michelle Nasuti office and immediately hospitalize for IV antibiotics. She felt better and was able to go home 5 days later. She took oral antibiotics for another 7 days. By the time she finished antibiotics she was feeling better but the pain had not gone away completely. Over the next few days she noted relapse of her pain and frequent urination. She was wise to go back on Cipro and metronidazole again. She has been on antibiotics for almost 2 weeks and still having hypogastric pain and now it radiates into her left lower quadrant where she has a burning sensation. She is still having increased urinary frequency and sense of incomplete evacuation but she does not have dysuria or hematuria.  Because of persistent symptoms she had a followup CT on 12/30/2012 which showed resolution of changes of diverticulitis. She denies fever although she's had chills. She says she has been passing flat stools for 6 months. She's been chronically constipated and generally have 3 bowel movements per week. Since recent admission she has been taking Metamucil and stools usually have been soft. She also complains of intermittent  hematochezia which started one year ago but she hasn't passed any blood in the last 2 months. She feels weak. She does not have good appetite. However she has not lost any weight recently. During recent hospitalization she was noted to have elevated TSH and levothyroxine dose was bumped up. She denies nausea vomiting or heartburn. She has noted metallic taste since she has been on antibiotics.  Current Medications: Current Outpatient Prescriptions  Medication Sig Dispense Refill  . acetaminophen (TYLENOL) 500 MG tablet Take 500 mg by mouth as needed.      . ALPRAZolam (XANAX) 1 MG tablet Take 0.5-1 mg by mouth 2 (two) times daily as needed for anxiety.      . ciprofloxacin (CIPRO) 500 MG tablet Take 1 tablet (500 mg total) by mouth 2 (two) times daily.  14 tablet  0  . HYDROcodone-acetaminophen (NORCO/VICODIN) 5-325 MG per tablet Take 1 tablet by mouth every 6 (six) hours as needed for pain.  20 tablet  0  . levothyroxine (SYNTHROID, LEVOTHROID) 88 MCG tablet Take 88 mcg by mouth daily before breakfast.      . metroNIDAZOLE (FLAGYL) 500 MG tablet Take 1 tablet (500 mg total) by mouth 3 (three) times daily.  21 tablet  0  . Multiple Vitamins-Calcium (ONE-A-DAY WOMENS FORMULA) TABS Take by mouth daily.      . ranitidine (ZANTAC) 150 MG tablet Take 150 mg by mouth as needed for heartburn.       No current facility-administered medications for this visit.   Past medical history;  Obesity. Since her graduation from high school she has gained over  100 pounds. Laparoscopic cholecystectomy about 6 years ago. Hypothyroidism was diagnosed on routine testing in March this year.  Allergies; NKA.  Family history; Father is 80 years old and underwent surgery for prostate carcinoma last year. He also has history of diverticulitis. Mother is 41 and has hypertension. She has 2 brothers and one had surgery for prostate carcinoma at age 43 another brother is in good health.  Social history; She is  married but does not have any children. She has managed convenience stores for 7 years but for the last 3 years she's been working at [employer]. She has been smoking cigarettes for 27 years and now trying to quit and down to half a pack per day. She drinks alcohol occasionally.  Physical examination;  Blood pressure 136/88, pulse 76, temperature 98.6 F (37 C), temperature source Oral, resp. rate 18, height 5\' 7"  (1.702 m), weight 271 lb 3.2 oz (123.016 kg), last menstrual period 12/18/2012. Patient is alert and in no acute distress. Conjunctiva is pink. Sclera is nonicteric Oropharyngeal mucosa is normal. No neck masses or thyromegaly noted. Cardiac exam with regular rhythm normal S1 and S2. No murmur or gallop noted. Lungs are clear to auscultation. Abdomen is obese. Bowel sounds are normal. On palpation she has soft abdomen with mild tenderness in hypogastric region as well as LLQ without guarding or rebound. No organomegaly or masses.  No LE edema or clubbing noted.  Labs/studies Results: Abdominal CT films from 12/02/2012 and 12/30/2012 reviewed along with patient.   Assessment:  #1. Patient is 42 year old Caucasian female who was diagnosed with sigmoid diverticulitis on 12/02/2012 and treated with 12 days of antibiotics 5 days of which were IV antibiotics during hospitalization. Initial CT suggested microperforation but no abscess or fluid collection. She has had urinary frequency and sense of incomplete evacuation since inflamed segment of sigmoid colon sits on top of the bladder. Her recovery has been slow and she is back on Cipro and metronidazole and followup CT one week ago was negative for abscess or persistent changes of diverticulitis. She will need to undergo colonoscopy when acute symptoms have resolved. Since she gives a six-month history of passing flat stools need to rule out colonic stricture but she may also have constipation predominant IBS.    Recommendations; Urine  analysis. Continue Cipro at 500 mg by mouth twice a day. Decrease metronidazole to 500 mg by mouth twice a day. Dicyclomine 10 mg by mouth 3 times a day. Can take Colace 20 mg by mouth daily if she feels she is constipated. Office visit in 2 weeks. Colonoscopy will be scheduled as soon as acute symptoms have resolved.

## 2013-01-06 ENCOUNTER — Encounter (INDEPENDENT_AMBULATORY_CARE_PROVIDER_SITE_OTHER): Payer: Self-pay | Admitting: Internal Medicine

## 2013-01-06 LAB — URINALYSIS, ROUTINE W REFLEX MICROSCOPIC
Hgb urine dipstick: NEGATIVE
Ketones, ur: NEGATIVE mg/dL
Nitrite: NEGATIVE
Specific Gravity, Urine: 1.009 (ref 1.005–1.030)
Urobilinogen, UA: 0.2 mg/dL (ref 0.0–1.0)

## 2013-01-18 ENCOUNTER — Ambulatory Visit (INDEPENDENT_AMBULATORY_CARE_PROVIDER_SITE_OTHER): Payer: BC Managed Care – PPO | Admitting: Internal Medicine

## 2013-01-18 ENCOUNTER — Encounter (INDEPENDENT_AMBULATORY_CARE_PROVIDER_SITE_OTHER): Payer: Self-pay | Admitting: Internal Medicine

## 2013-01-18 VITALS — BP 124/80 | HR 78 | Temp 98.6°F | Resp 18 | Ht 67.0 in | Wt 276.8 lb

## 2013-01-18 DIAGNOSIS — R1032 Left lower quadrant pain: Secondary | ICD-10-CM

## 2013-01-18 DIAGNOSIS — K5732 Diverticulitis of large intestine without perforation or abscess without bleeding: Secondary | ICD-10-CM

## 2013-01-18 NOTE — Patient Instructions (Signed)
Discontinue Cipro and metronidazole on the evening of 01/24/2013. Continue dicyclomine as before. Colonoscopy to be scheduled to its end of next week; office will call.

## 2013-01-18 NOTE — Progress Notes (Signed)
Presenting complaint;   followup for sigmoid diverticulitis.  Subjective:  Patient is a 42 year old Caucasian female who presents for two-week followup visit in reference to her sigmoid diverticulitis. This condition was history diagnosed on 12/02/2012. Her symptoms relapsed after antibiotic therapy for 12 days. Antibiotic therapy was resumed. She had followup abdominopelvic CT on 12/30/2012 showed resolution of changes of diverticulitis compared to CT of 12/02/2012. Her last visit she was still quite tender in the left low quadrant hypogastric region and advised to continue antibiotic therapy. She is also given prescription for dicyclomine. She feels much better. She only has taken pain pills 3 times since her last visit. Her appetite is back to normal. She still feels weak. She has chills but no fever. She says burning in left low quadrant of her abdomen has resolved. She is still having some discomfort in hypogastric region and LUQ. She is on her period and wonders if it is contributing to some of her pain. She is not experiencing any side effects with dicyclomine. She still having 2-3 formed and flat stools daily. She denies rectal bleeding.  Current Medications: Current Outpatient Prescriptions  Medication Sig Dispense Refill  . acetaminophen (TYLENOL) 500 MG tablet Take 500 mg by mouth as needed.      . ALPRAZolam (XANAX) 1 MG tablet Take 0.5-1 mg by mouth 2 (two) times daily as needed for anxiety.      . ciprofloxacin (CIPRO) 500 MG tablet Take 1 tablet (500 mg total) by mouth 2 (two) times daily.  14 tablet  0  . dicyclomine (BENTYL) 10 MG capsule Take 1 capsule (10 mg total) by mouth 3 (three) times daily before meals.  90 capsule  1  . HYDROcodone-acetaminophen (NORCO/VICODIN) 5-325 MG per tablet Take 1 tablet by mouth every 6 (six) hours as needed for pain.  20 tablet  0  . levothyroxine (SYNTHROID, LEVOTHROID) 88 MCG tablet Take 88 mcg by mouth daily before breakfast.      .  metroNIDAZOLE (FLAGYL) 500 MG tablet Take 1 tablet (500 mg total) by mouth 2 (two) times daily.  28 tablet  0  . Multiple Vitamins-Calcium (ONE-A-DAY WOMENS FORMULA) TABS Take by mouth daily.      . ranitidine (ZANTAC) 150 MG tablet Take 150 mg by mouth as needed for heartburn.       No current facility-administered medications for this visit.     Objective: Blood pressure 124/80, pulse 78, temperature 98.6 F (37 C), temperature source Oral, resp. rate 18, height 5\' 7"  (1.702 m), weight 276 lb 12.8 oz (125.556 kg), last menstrual period 01/17/2013. Patient is alert and in no acute distress. Conjunctiva is pink. Sclera is nonicteric Oropharyngeal mucosa is normal. No neck masses or thyromegaly noted. Cardiac exam with regular rhythm normal S1 and S2. No murmur or gallop noted. Lungs are clear to auscultation. Abdomen is full. Bowel sounds are normal. Abdomen is soft with mild tenderness at LUQ, LLQ and hypogastric region but no guarding noted.  No LE edema or clubbing noted.    Assessment:  #1. Sigmoid diverticulitis with protracted course. Overall she is doing much better but she still has tenderness and therefore needs to continue antibiotic for another one week. She may also have an element of IBS given that she's had flat stools for over 6 months.   Plan:  Continue Cipro and metronidazole for one more week and stop. Continue dicyclomine at 10 mg by mouth 3 times a day. Will schedule patient for diagnostic colonoscopy to its  end of next week.

## 2013-01-19 ENCOUNTER — Other Ambulatory Visit (INDEPENDENT_AMBULATORY_CARE_PROVIDER_SITE_OTHER): Payer: Self-pay | Admitting: *Deleted

## 2013-01-19 ENCOUNTER — Telehealth (INDEPENDENT_AMBULATORY_CARE_PROVIDER_SITE_OTHER): Payer: Self-pay | Admitting: *Deleted

## 2013-01-19 DIAGNOSIS — K5792 Diverticulitis of intestine, part unspecified, without perforation or abscess without bleeding: Secondary | ICD-10-CM

## 2013-01-19 DIAGNOSIS — Z1211 Encounter for screening for malignant neoplasm of colon: Secondary | ICD-10-CM

## 2013-01-19 MED ORDER — PEG-KCL-NACL-NASULF-NA ASC-C 100 G PO SOLR: 1.0000 | Freq: Once | ORAL | Status: DC

## 2013-01-19 NOTE — Telephone Encounter (Signed)
Patient needs movi prep 

## 2013-01-25 ENCOUNTER — Encounter (HOSPITAL_COMMUNITY): Payer: Self-pay

## 2013-02-05 ENCOUNTER — Ambulatory Visit (HOSPITAL_COMMUNITY)
Admission: RE | Admit: 2013-02-05 | Discharge: 2013-02-05 | Disposition: A | Discharge status: Home / Self Care | Payer: BC Managed Care – PPO | Source: Ambulatory Visit | Attending: Internal Medicine | Admitting: Internal Medicine

## 2013-02-05 ENCOUNTER — Encounter (HOSPITAL_COMMUNITY): Payer: Self-pay | Admitting: *Deleted

## 2013-02-05 ENCOUNTER — Encounter (HOSPITAL_COMMUNITY)
Admission: RE | Disposition: A | Discharge status: Home / Self Care | Payer: Self-pay | Source: Ambulatory Visit | Attending: Internal Medicine

## 2013-02-05 DIAGNOSIS — F17200 Nicotine dependence, unspecified, uncomplicated: Secondary | ICD-10-CM | POA: Insufficient documentation

## 2013-02-05 DIAGNOSIS — K5730 Diverticulosis of large intestine without perforation or abscess without bleeding: Secondary | ICD-10-CM

## 2013-02-05 DIAGNOSIS — Z09 Encounter for follow-up examination after completed treatment for conditions other than malignant neoplasm: Secondary | ICD-10-CM | POA: Insufficient documentation

## 2013-02-05 DIAGNOSIS — K5732 Diverticulitis of large intestine without perforation or abscess without bleeding: Secondary | ICD-10-CM

## 2013-02-05 DIAGNOSIS — K5792 Diverticulitis of intestine, part unspecified, without perforation or abscess without bleeding: Secondary | ICD-10-CM

## 2013-02-05 HISTORY — PX: COLONOSCOPY: SHX5424

## 2013-02-05 SURGERY — COLONOSCOPY
Anesthesia: Moderate Sedation

## 2013-02-05 MED ORDER — PROMETHAZINE HCL 25 MG/ML IJ SOLN
INTRAMUSCULAR | Status: AC
  Filled 2013-02-05: qty 1

## 2013-02-05 MED ORDER — MEPERIDINE HCL 50 MG/ML IJ SOLN
INTRAMUSCULAR | Status: AC
  Filled 2013-02-05: qty 1

## 2013-02-05 MED ORDER — SODIUM CHLORIDE 0.9 % IJ SOLN
INTRAMUSCULAR | Status: AC
  Filled 2013-02-05: qty 10

## 2013-02-05 MED ORDER — LIDOCAINE HCL 2 % EX GEL
CUTANEOUS | Status: DC | PRN
  Administered 2013-02-05: 1 via TOPICAL

## 2013-02-05 MED ORDER — FLUCONAZOLE 100 MG PO TABS: 100.0000 mg | ORAL_TABLET | Freq: Every day | ORAL | Status: DC

## 2013-02-05 MED ORDER — MEPERIDINE HCL 50 MG/ML IJ SOLN
INTRAMUSCULAR | Status: DC | PRN
  Administered 2013-02-05 (×2): 25 mg via INTRAVENOUS

## 2013-02-05 MED ORDER — LIDOCAINE HCL 2 % EX GEL
CUTANEOUS | Status: AC
  Filled 2013-02-05: qty 30

## 2013-02-05 MED ORDER — SODIUM CHLORIDE 0.9 % IV SOLN
INTRAVENOUS | Status: DC
  Administered 2013-02-05: 07:00:00 via INTRAVENOUS

## 2013-02-05 MED ORDER — MIDAZOLAM HCL 5 MG/5ML IJ SOLN
INTRAMUSCULAR | Status: AC
  Filled 2013-02-05: qty 5

## 2013-02-05 MED ORDER — STERILE WATER FOR IRRIGATION IR SOLN
Status: DC | PRN
  Administered 2013-02-05: 08:00:00

## 2013-02-05 MED ORDER — MIDAZOLAM HCL 5 MG/5ML IJ SOLN
INTRAMUSCULAR | Status: AC
  Filled 2013-02-05: qty 10

## 2013-02-05 MED ORDER — PROMETHAZINE HCL 25 MG/ML IJ SOLN
INTRAMUSCULAR | Status: DC | PRN
  Administered 2013-02-05 (×2): 12.5 mg via INTRAVENOUS

## 2013-02-05 MED ORDER — MIDAZOLAM HCL 5 MG/5ML IJ SOLN
INTRAMUSCULAR | Status: DC | PRN
  Administered 2013-02-05: 3 mg via INTRAVENOUS
  Administered 2013-02-05 (×3): 2 mg via INTRAVENOUS
  Administered 2013-02-05: 3 mg via INTRAVENOUS

## 2013-02-05 NOTE — Op Note (Signed)
COLONOSCOPY PROCEDURE REPORT  PATIENT:  [name]  MR#:  [id number] Birthdate:  1970-09-25, 42 y.o., female Endoscopist:  Dr. Malissa Hippo, MD Referred By:  Dr. Lilyan Punt, MD Procedure Date: 02/05/2013  Procedure:   Colonoscopy  Indications:  Patient is 42 year old Caucasian female with history of sigmoid diverticulitis requiring several days of antibiotic. She is feeling much better. She is undergoing diagnostic colonoscopy.  Informed Consent:  The procedure and risks were reviewed with the patient and informed consent was obtained.  Medications:  Demerol 50 mg IV Versed 12 mg IV Promethazine 25 mg IV and diluted form.  Description of procedure:  After a digital rectal exam was performed, that colonoscope was advanced from the anus through the rectum and colon to the area of the cecum, ileocecal valve and appendiceal orifice. The cecum was deeply intubated. These structures were well-seen and photographed for the record. From the level of the cecum and ileocecal valve, the scope was slowly and cautiously withdrawn. The mucosal surfaces were carefully surveyed utilizing scope tip to flexion to facilitate fold flattening as needed. The scope was pulled down into the rectum where a thorough exam including retroflexion was performed.  Findings:   Prep excellent. Single diverticulum in the hepatic flexure. Multiple  diverticula noted at sigmoid colon. Normal rectal mucosa and anal rectal junction.   Therapeutic/Diagnostic Maneuvers Performed:   None  Complications:  None  Cecal Withdrawal Time:  8 minutes  Impression:  Examination performed to cecum. Multiple diverticula at sigmoid colon along with one at hepatic flexure. No evidence endoscopic colitis or polyps.  Recommendations:  Standard instructions given. High fiber diet. Fiber supplement 4 g by mouth daily. Fluconazole 100 mg by mouth daily for 3 days(vaginal candidiasis). Office visit in 3  months.  Anetha Slagel U  02/05/2013 8:09 AM  CC: Dr. Milana Obey, MD & Dr. Bonnetta Barry ref. provider found

## 2013-02-05 NOTE — H&P (Signed)
[name] is an 42 y.o. female.   Chief Complaint: Patient is here for colonoscopy. HPI: Patient is 42 year old Caucasian female who presented over 2 months ago  with lower abdominal pain and felt to have diverticulitis CT. She's required several weeks of antibiotic therapy slightly improved. She did have a followup CT in 5 weeks ago revealing improvement in changes of diverticulitis. She's been off antibiotics for 2 weeks. She denies melena or rectal bleeding. She is undergoing diagnostic colonoscopy.  Past Medical History  Diagnosis Date  . Anxiety   . Hypothyroidism   . Tobacco abuse 12/02/2012  . Diverticulitis     Past Surgical History  Procedure Laterality Date  . Cholecystectomy  2008    [facility]    Family History  Problem Relation Age of Onset  . Hypertension Mother   . Prostate cancer Father   . Diverticulitis Father   . Prostate cancer Brother    Social History:  reports that she has been smoking.  She has never used smokeless tobacco. She reports that she does not drink alcohol or use illicit drugs.  Allergies: No Known Allergies  Medications Prior to Admission  Medication Sig Dispense Refill  . acetaminophen (TYLENOL) 500 MG tablet Take 500 mg by mouth daily as needed for mild pain.       Marland Kitchen ALPRAZolam (XANAX) 1 MG tablet Take 0.5-1 mg by mouth 2 (two) times daily as needed for anxiety.      . ciprofloxacin (CIPRO) 500 MG tablet Take 1 tablet (500 mg total) by mouth 2 (two) times daily.  14 tablet  0  . dicyclomine (BENTYL) 10 MG capsule Take 1 capsule (10 mg total) by mouth 3 (three) times daily before meals.  90 capsule  1  . HYDROcodone-acetaminophen (NORCO/VICODIN) 5-325 MG per tablet Take 1 tablet by mouth daily as needed for moderate pain.      Marland Kitchen levothyroxine (SYNTHROID, LEVOTHROID) 88 MCG tablet Take 88 mcg by mouth daily before breakfast.      . metroNIDAZOLE (FLAGYL) 500 MG tablet Take 1 tablet (500 mg total) by mouth 2 (two) times daily.  28  tablet  0  . Multiple Vitamins-Calcium (ONE-A-DAY WOMENS FORMULA) TABS Take 1 tablet by mouth daily.       . ranitidine (ZANTAC) 150 MG tablet Take 150 mg by mouth as needed for heartburn.        No results found for this or any previous visit (from the past 48 hour(s)). No results found.  ROS  Blood pressure 119/63, pulse 83, temperature 98.7 F (37.1 C), temperature source Oral, resp. rate 18, height 5\' 7"  (1.702 m), weight 276 lb (125.193 kg), last menstrual period 01/17/2013, SpO2 97.00%. Physical Exam  Constitutional: She appears well-developed and well-nourished.  HENT:  Mouth/Throat: Oropharynx is clear and moist.  Eyes: Conjunctivae are normal. No scleral icterus.  Neck: No thyromegaly present.  Cardiovascular: Normal rate, regular rhythm and normal heart sounds.   No murmur heard. Respiratory: Effort normal and breath sounds normal.  GI:   protuberant abdomen with mild LLQ tenderness. No guarding rebound. No organomegaly or masses.  Musculoskeletal: She exhibits no edema.  Lymphadenopathy:    She has no cervical adenopathy.  Neurological: She is alert.  Skin: Skin is warm and dry.     Assessment/Plan History of sigmoid diverticulitis with protracted course. Diagnostic colonoscopy.  Alberta Cairns U 02/05/2013, 7:32 AM

## 2013-02-09 ENCOUNTER — Encounter (HOSPITAL_COMMUNITY): Payer: Self-pay | Admitting: Internal Medicine

## 2015-08-17 IMAGING — CR DG ABDOMEN 1V
2 series · 2 of 2 positions shown · non-contrast
Comparison: None.

CLINICAL DATA: Abdominal pain. Emesis. Constipation.

EXAM:
ABDOMEN - 1 VIEW

[view not recorded (1 of 2)]
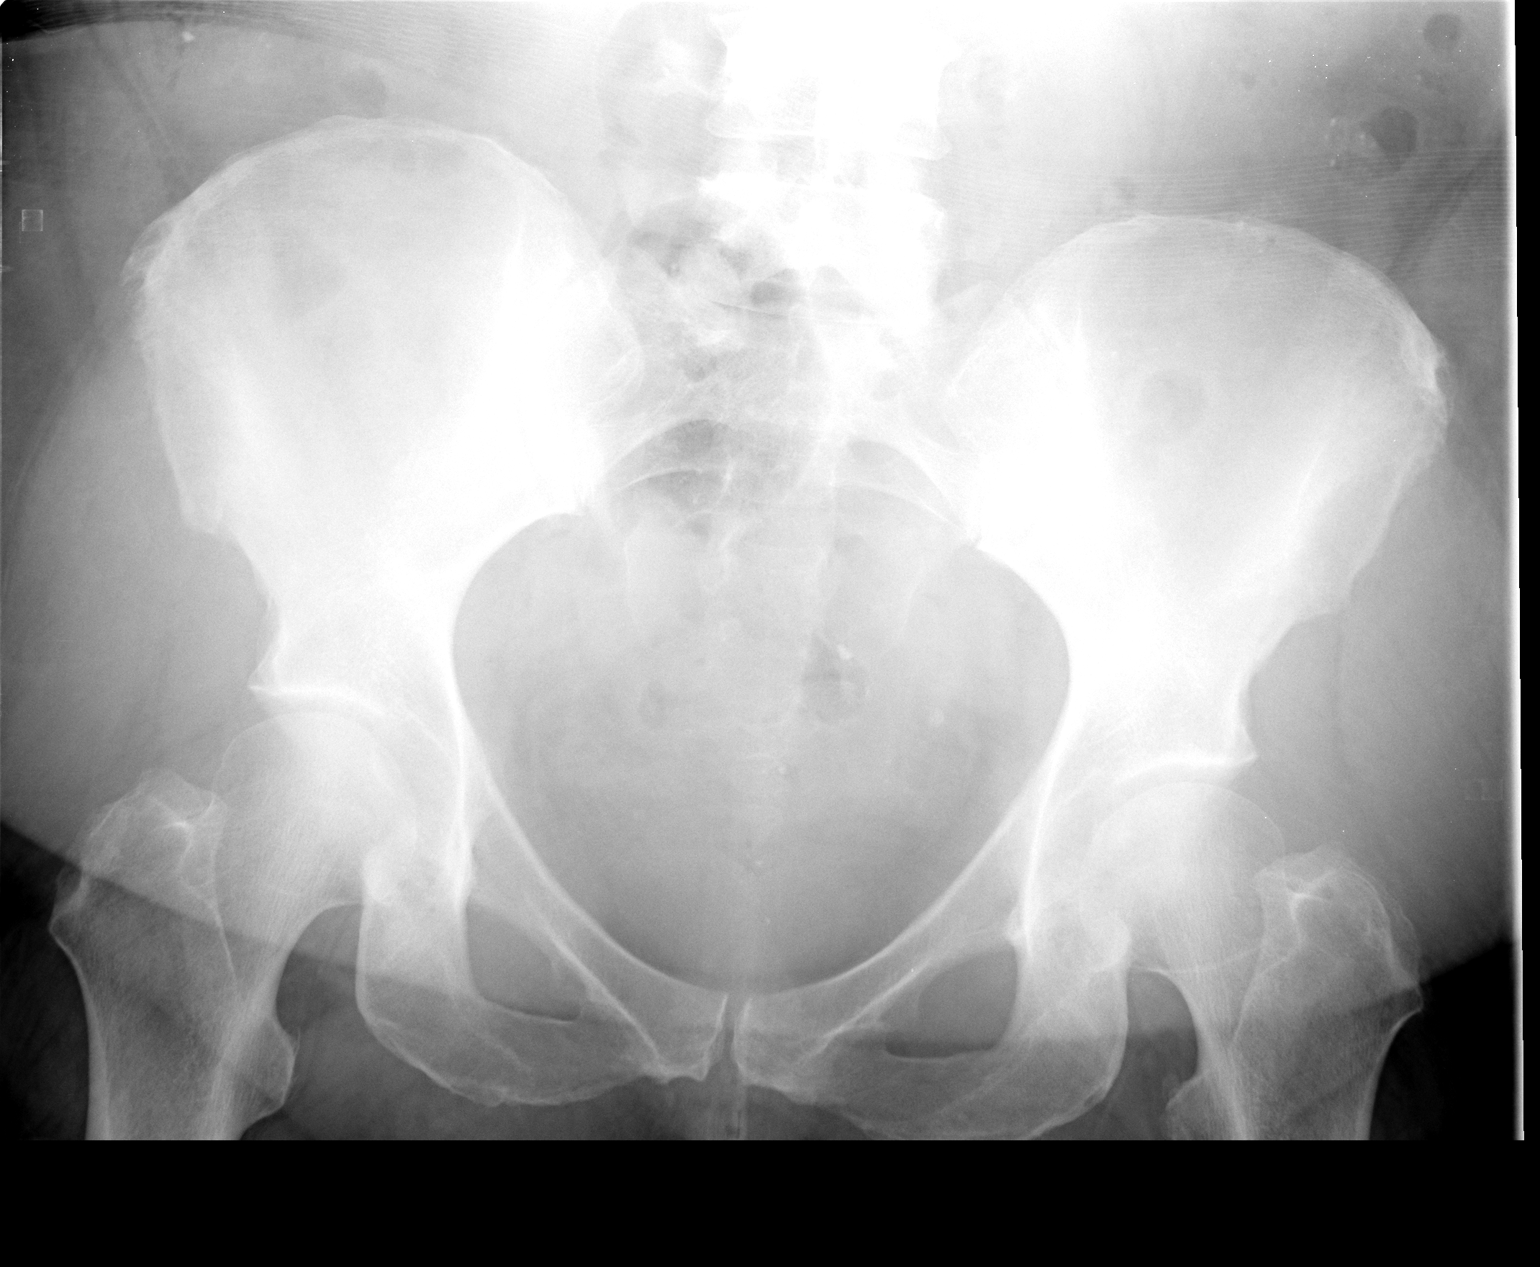

[view not recorded (2 of 2)]
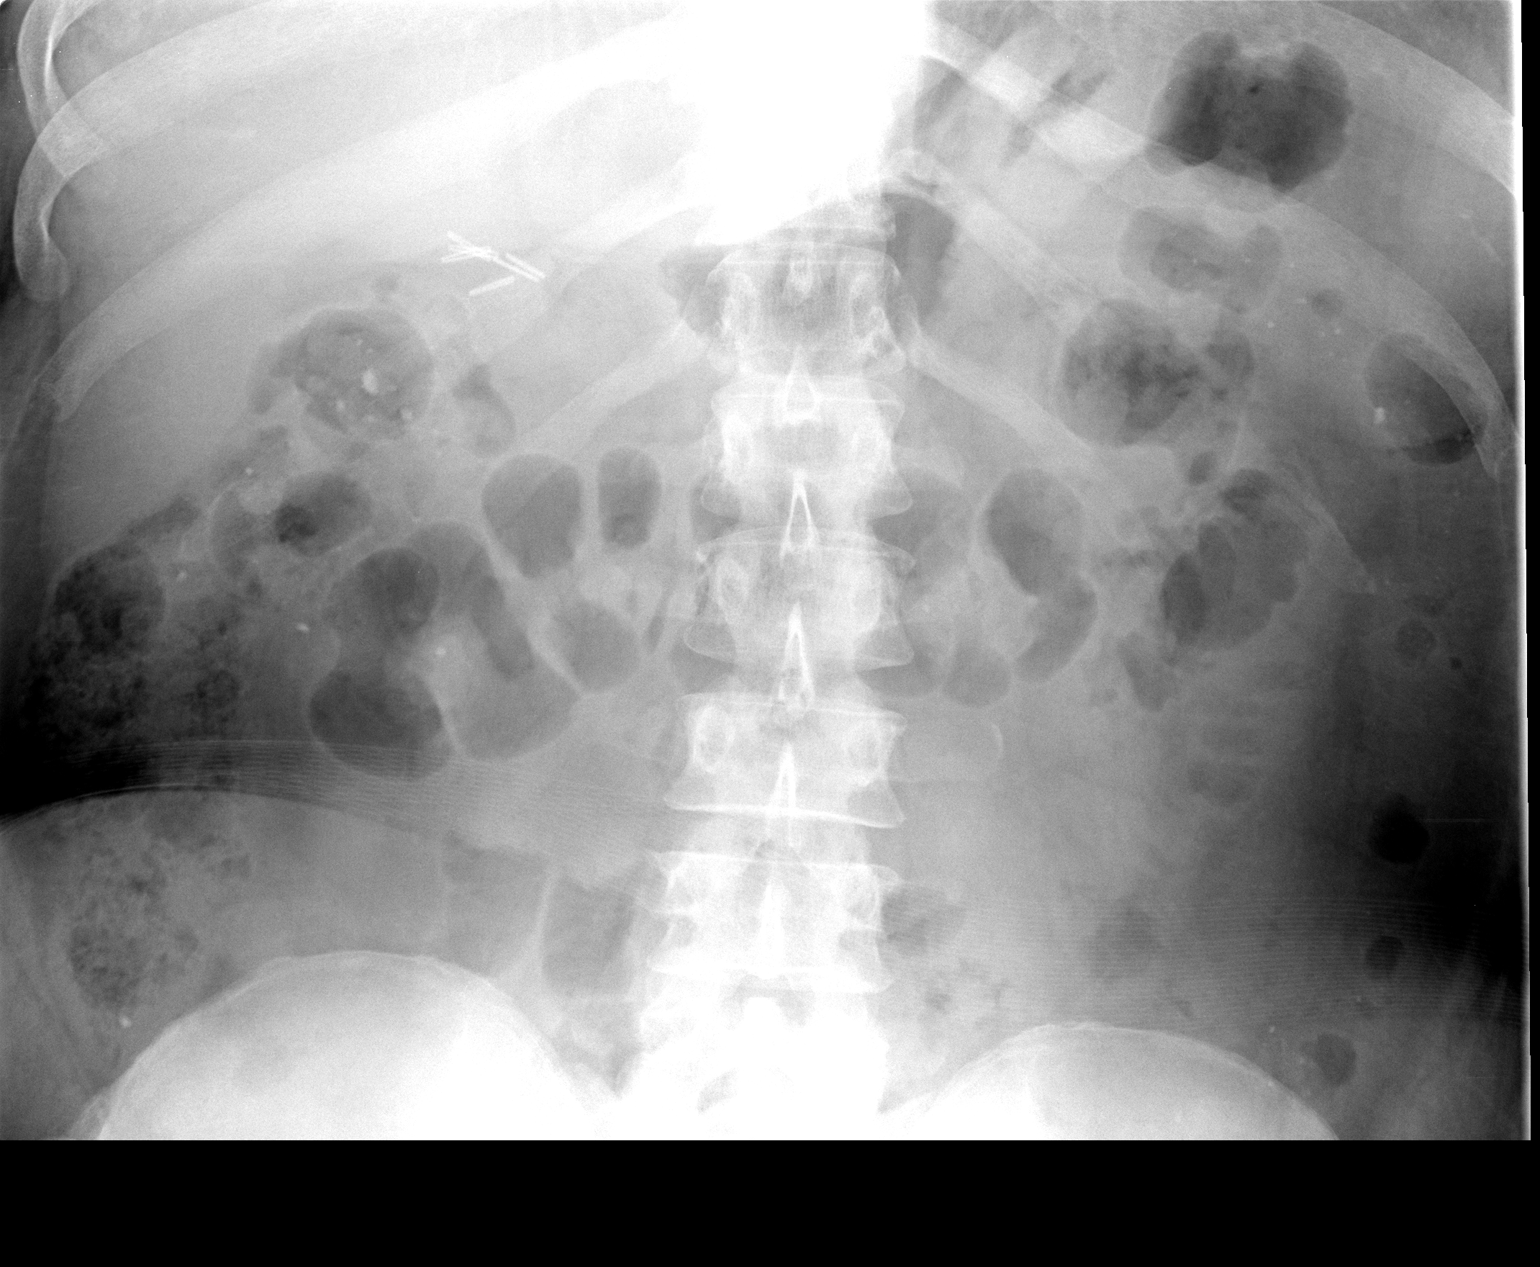

[2 of 2 positions shown; findings below may reference images not displayed]

FINDINGS: No evidence of bowel obstruction. Stool volume is within normal
limits. There is high-density material throughout the fecal stream.
When accounting for this, no abnormal intra-abdominal calcification
is suspected. Cholecystectomy. No acute osseous findings.
IMPRESSION: Negative.

## 2015-11-10 ENCOUNTER — Encounter: Payer: Self-pay | Admitting: Adult Health

## 2015-11-10 ENCOUNTER — Other Ambulatory Visit (HOSPITAL_COMMUNITY)
Admission: RE | Admit: 2015-11-10 | Discharge: 2015-11-10 | Disposition: A | Discharge status: Home / Self Care | Payer: BLUE CROSS/BLUE SHIELD | Source: Ambulatory Visit | Attending: Adult Health | Admitting: Adult Health

## 2015-11-10 ENCOUNTER — Ambulatory Visit (INDEPENDENT_AMBULATORY_CARE_PROVIDER_SITE_OTHER): Payer: BLUE CROSS/BLUE SHIELD | Admitting: Adult Health

## 2015-11-10 VITALS — BP 120/60 | HR 94 | Ht 66.0 in | Wt 261.5 lb

## 2015-11-10 DIAGNOSIS — R140 Abdominal distension (gaseous): Secondary | ICD-10-CM

## 2015-11-10 DIAGNOSIS — Z1212 Encounter for screening for malignant neoplasm of rectum: Secondary | ICD-10-CM | POA: Diagnosis not present

## 2015-11-10 DIAGNOSIS — N816 Rectocele: Secondary | ICD-10-CM | POA: Diagnosis not present

## 2015-11-10 DIAGNOSIS — Z01419 Encounter for gynecological examination (general) (routine) without abnormal findings: Secondary | ICD-10-CM | POA: Diagnosis present

## 2015-11-10 DIAGNOSIS — R232 Flushing: Secondary | ICD-10-CM

## 2015-11-10 DIAGNOSIS — N926 Irregular menstruation, unspecified: Secondary | ICD-10-CM

## 2015-11-10 DIAGNOSIS — Z3202 Encounter for pregnancy test, result negative: Secondary | ICD-10-CM | POA: Diagnosis not present

## 2015-11-10 DIAGNOSIS — N898 Other specified noninflammatory disorders of vagina: Secondary | ICD-10-CM

## 2015-11-10 DIAGNOSIS — N920 Excessive and frequent menstruation with regular cycle: Secondary | ICD-10-CM

## 2015-11-10 DIAGNOSIS — N951 Menopausal and female climacteric states: Secondary | ICD-10-CM | POA: Diagnosis not present

## 2015-11-10 DIAGNOSIS — Z1151 Encounter for screening for human papillomavirus (HPV): Secondary | ICD-10-CM | POA: Insufficient documentation

## 2015-11-10 DIAGNOSIS — L918 Other hypertrophic disorders of the skin: Secondary | ICD-10-CM

## 2015-11-10 DIAGNOSIS — Z113 Encounter for screening for infections with a predominantly sexual mode of transmission: Secondary | ICD-10-CM | POA: Diagnosis present

## 2015-11-10 DIAGNOSIS — K30 Functional dyspepsia: Secondary | ICD-10-CM

## 2015-11-10 DIAGNOSIS — R109 Unspecified abdominal pain: Secondary | ICD-10-CM

## 2015-11-10 DIAGNOSIS — Z124 Encounter for screening for malignant neoplasm of cervix: Secondary | ICD-10-CM | POA: Diagnosis not present

## 2015-11-10 LAB — HEMOCCULT GUIAC POC 1CARD (OFFICE): Fecal Occult Blood, POC: NEGATIVE

## 2015-11-10 LAB — POCT URINE PREGNANCY: Preg Test, Ur: NEGATIVE

## 2015-11-10 NOTE — Progress Notes (Signed)
Subjective:     Patient ID: [name], female   DOB: 02/03/1971, 45 y.o.   MRN: [id number]  HPI [name] is a 45 year old white female, new to this practice in complaining of irregular periods, 3 in 2 years, about 8 months apart and random spotting and vaginal bleeding after BM.She has hot flashes and sweats and Dr Karie Kirks put her on estrace about 2 months ago(no progesterone given), has not helped.She says stomach hurts and feels hard and she is bloated.She had colonoscopy in 2014 for diverticulitis. Last pap over 11 years ago and has not had mammogram.She works third shift.  PCP is Dr Karie Kirks.  Review of Systems  + irregular periods, 3 in 2 years,  Spotting Bloated Stomach pain Vaginal bleeding with BMs Hot flashes No sex in over 2 years  Reviewed past medical,surgical, social and family history. Reviewed medications and allergies.     Objective:   Physical Exam BP 120/60 (BP Location: Left Arm, Patient Position: Sitting, Cuff Size: Large)   Pulse 94   Ht 5\' 6"  (1.676 m)   Wt 261 lb 8 oz (118.6 kg)   BMI 42.21 kg/m    UPT negative.  Skin warm and dry. Neck: mid line trachea, normal thyroid, good ROM, no lymphadenopathy noted. Lungs: clear to ausculation bilaterally. Cardiovascular: regular rate and rhythm. Abdomen is soft and non tender, no HSM noted, obese Pelvic: external genitalia is normal in appearance no lesions,Has numerous skin tags in groin area, vagina: pink with good moisture and rugae,urethra has no lesions or masses noted, cervix:smooth and pap with HPV and GC/CHL performed, uterus: normal size, shape and contour, mildly tender, no masses felt,but difficult secondary to abdominal girth  adnexa: no masses or tenderness noted. Bladder is non tender and no masses felt. Rectal exam:has good tone, +hemorrhoids and +rectocele, hemoccult negative. PHQ 2 score 0. Will get [place] to assess, she is off on Fridays.  Assessment:     1. Irregular periods   2. Stomach pain   3.  Spotting   4. Hot flashes   5. Bloated abdomen   6. Pregnancy examination or test, negative result   7. Rectocele   8. Cutaneous skin tags       Plan:   Pap with HPV and GC/CHL sent  Return in 1 week for GYN [place] Review handout on menopause Is getting labs for PCP  Will talk after [place] and then get back in for skin tag removal at her request Get mammogram, number given

## 2015-11-10 NOTE — Addendum Note (Signed)
Addended by: Linton Rump on: 11/10/2015 10:57 AM   Modules accepted: Orders

## 2015-11-10 NOTE — Patient Instructions (Signed)
Menopause Menopause is the normal time of life when menstrual periods stop completely. Menopause is complete when you have missed 12 consecutive menstrual periods. It usually occurs between the ages of 48 years and 55 years. Very rarely does a woman develop menopause before the age of 40 years. At menopause, your ovaries stop producing the female hormones estrogen and progesterone. This can cause undesirable symptoms and also affect your health. Sometimes the symptoms may occur 4-5 years before the menopause begins. There is no relationship between menopause and:  Oral contraceptives.  Number of children you had.  Race.  The age your menstrual periods started (menarche). Heavy smokers and very thin women may develop menopause earlier in life. CAUSES  The ovaries stop producing the female hormones estrogen and progesterone.  Other causes include:  Surgery to remove both ovaries.  The ovaries stop functioning for no known reason.  Tumors of the pituitary gland in the brain.  Medical disease that affects the ovaries and hormone production.  Radiation treatment to the abdomen or pelvis.  Chemotherapy that affects the ovaries. SYMPTOMS   Hot flashes.  Night sweats.  Decrease in sex drive.  Vaginal dryness and thinning of the vagina causing painful intercourse.  Dryness of the skin and developing wrinkles.  Headaches.  Tiredness.  Irritability.  Memory problems.  Weight gain.  Bladder infections.  Hair growth of the face and chest.  Infertility. More serious symptoms include:  Loss of bone (osteoporosis) causing breaks (fractures).  Depression.  Hardening and narrowing of the arteries (atherosclerosis) causing heart attacks and strokes. DIAGNOSIS   When the menstrual periods have stopped for 12 straight months.  Physical exam.  Hormone studies of the blood. TREATMENT  There are many treatment choices and nearly as many questions about them. The  decisions to treat or not to treat menopausal changes is an individual choice made with your health care provider. Your health care provider can discuss the treatments with you. Together, you can decide which treatment will work best for you. Your treatment choices may include:   Hormone therapy (estrogen and progesterone).  Non-hormonal medicines.  Treating the individual symptoms with medicine (for example antidepressants for depression).  Herbal medicines that may help specific symptoms.  Counseling by a psychiatrist or psychologist.  Group therapy.  Lifestyle changes including:  Eating healthy.  Regular exercise.  Limiting caffeine and alcohol.  Stress management and meditation.  No treatment. HOME CARE INSTRUCTIONS   Take the medicine your health care provider gives you as directed.  Get plenty of sleep and rest.  Exercise regularly.  Eat a diet that contains calcium (good for the bones) and soy products (acts like estrogen hormone).  Avoid alcoholic beverages.  Do not smoke.  If you have hot flashes, dress in layers.  Take supplements, calcium, and vitamin D to strengthen bones.  You can use over-the-counter lubricants or moisturizers for vaginal dryness.  Group therapy is sometimes very helpful.  Acupuncture may be helpful in some cases. SEEK MEDICAL CARE IF:   You are not sure you are in menopause.  You are having menopausal symptoms and need advice and treatment.  You are still having menstrual periods after age 55 years.  You have pain with intercourse.  Menopause is complete (no menstrual period for 12 months) and you develop vaginal bleeding.  You need a referral to a specialist (gynecologist, psychiatrist, or psychologist) for treatment. SEEK IMMEDIATE MEDICAL CARE IF:   You have severe depression.  You have excessive vaginal bleeding.    You fell and think you have a broken bone.  You have pain when you urinate.  You develop leg or  chest pain.  You have a fast pounding heart beat (palpitations).  You have severe headaches.  You develop vision problems.  You feel a lump in your breast.  You have abdominal pain or severe indigestion.   This information is not intended to replace advice given to you by your health care provider. Make sure you discuss any questions you have with your health care provider.   Document Released: 04/27/2003 Document Revised: 10/07/2012 Document Reviewed: 09/03/2012 Elsevier Interactive Patient Education [employer]. Return in 1 week for GYN [place]

## 2015-11-13 LAB — CYTOLOGY - PAP

## 2015-11-17 ENCOUNTER — Ambulatory Visit (INDEPENDENT_AMBULATORY_CARE_PROVIDER_SITE_OTHER): Payer: BLUE CROSS/BLUE SHIELD

## 2015-11-17 DIAGNOSIS — N83291 Other ovarian cyst, right side: Secondary | ICD-10-CM

## 2015-11-17 DIAGNOSIS — D251 Intramural leiomyoma of uterus: Secondary | ICD-10-CM

## 2015-11-17 DIAGNOSIS — N854 Malposition of uterus: Secondary | ICD-10-CM

## 2015-11-17 DIAGNOSIS — K30 Functional dyspepsia: Secondary | ICD-10-CM | POA: Diagnosis not present

## 2015-11-17 DIAGNOSIS — N926 Irregular menstruation, unspecified: Secondary | ICD-10-CM

## 2015-11-17 DIAGNOSIS — R109 Unspecified abdominal pain: Secondary | ICD-10-CM

## 2015-11-17 NOTE — Progress Notes (Signed)
PELVIC US TA/TV: Heterogenous anteverted uterus,ant intramural fibroid 1.5 x 1.5 x .8 mm,eec 2.7 mm,normal ov's bilat,1.8 x 1.1 x 1.4 cm rt paraovarian simple cyst,small amount of cul de sac fluid,no pain during ultrasound,ov's appear mobile

## 2015-11-17 NOTE — Progress Notes (Signed)
PELVIC US TA/TV: Homogeneous anteverted uterus,wnl,normal ov's bilat,EEC 13.9 mm,no free fluid,no pain during ultrasound,ov's appear mobile

## 2015-11-21 ENCOUNTER — Telehealth: Payer: Self-pay | Admitting: Adult Health

## 2015-11-21 NOTE — Telephone Encounter (Signed)
Left message to call me back about [place]

## 2015-11-21 NOTE — Telephone Encounter (Signed)
Left message to call me.

## 2015-11-22 ENCOUNTER — Telehealth: Payer: Self-pay | Admitting: Adult Health

## 2015-11-22 MED ORDER — ESTRADIOL 1 MG PO TABS: 1.0000 mg | ORAL_TABLET | Freq: Every day | ORAL | 2 refills | Status: DC

## 2015-11-22 MED ORDER — PROGESTERONE MICRONIZED 200 MG PO CAPS: ORAL_CAPSULE | ORAL | 2 refills | Status: DC

## 2015-11-22 NOTE — Telephone Encounter (Signed)
Pt aware that [place] was normal, and will add Prometrium and increase estrace and follow up in 2 months

## 2016-01-01 ENCOUNTER — Telehealth: Payer: Self-pay | Admitting: Adult Health

## 2016-01-01 ENCOUNTER — Ambulatory Visit (INDEPENDENT_AMBULATORY_CARE_PROVIDER_SITE_OTHER): Payer: BLUE CROSS/BLUE SHIELD | Admitting: Adult Health

## 2016-01-01 ENCOUNTER — Encounter: Payer: Self-pay | Admitting: Adult Health

## 2016-01-01 VITALS — BP 148/80 | HR 88 | Ht 66.75 in | Wt 266.0 lb

## 2016-01-01 DIAGNOSIS — N938 Other specified abnormal uterine and vaginal bleeding: Secondary | ICD-10-CM

## 2016-01-01 DIAGNOSIS — T192XXA Foreign body in vulva and vagina, initial encounter: Secondary | ICD-10-CM

## 2016-01-01 NOTE — Progress Notes (Signed)
Subjective:     Patient ID: [name], female   DOB: 12/19/1970, 45 y.o.   MRN: [id number]  HPI [name] is a 45 year old white female, in for ?retained tampon.She has been bleeding heavy and uses tampon and pad at work, and thinks she left tampon in.   Review of Systems ?retained tampon, bleeding heavy Reviewed past medical,surgical, social and family history. Reviewed medications and allergies.     Objective:   Physical Exam BP (!) 148/80 (BP Location: Left Arm, Patient Position: Sitting, Cuff Size: Large)   Pulse 88   Ht 5' 6.75" (1.695 m)   Wt 266 lb (120.7 kg)   LMP 12/29/2015   BMI 41.97 kg/m PHQ 2 score 0.Skin warm and dry.Pelvic: external genitalia is normal in appearance no lesions, vagina: +period like blood, no tampon seen,urethra has no lesions or masses noted, cervix:smooth and bulbous, uterus: normal size, shape and contour, non tender, no masses felt, adnexa: no masses or tenderness noted. Bladder is non tender and no masses felt.    Assessment:   Retained tampon,none found    Plan:     Use pads Keep appt 01/26/16

## 2016-01-01 NOTE — Telephone Encounter (Signed)
Thinks has retained tampon to come in at 11:15 today

## 2016-01-01 NOTE — Telephone Encounter (Signed)
Left message that I called.

## 2016-01-01 NOTE — Telephone Encounter (Signed)
Pt called stating that she would like an appointment today because she had an emergency. I looked at schedule and there is not for this week. Pt only wants to see jennifer. Pt states that she has a tampon stuck. She is not sure she could wait to long with that up there. Please contact pt

## 2016-01-01 NOTE — Patient Instructions (Signed)
Keep appt for 12/8  Use pads

## 2016-01-26 ENCOUNTER — Ambulatory Visit (INDEPENDENT_AMBULATORY_CARE_PROVIDER_SITE_OTHER): Payer: BLUE CROSS/BLUE SHIELD | Admitting: Adult Health

## 2016-01-26 ENCOUNTER — Encounter: Payer: Self-pay | Admitting: Adult Health

## 2016-01-26 VITALS — BP 148/78 | HR 95 | Ht 66.75 in | Wt 264.0 lb

## 2016-01-26 DIAGNOSIS — N921 Excessive and frequent menstruation with irregular cycle: Secondary | ICD-10-CM

## 2016-01-26 MED ORDER — MEGESTROL ACETATE 40 MG PO TABS: ORAL_TABLET | ORAL | 1 refills | Status: DC

## 2016-01-26 NOTE — Patient Instructions (Addendum)
Take megace  Follow up 12/22 for pre op with Dr Elonda Husky  Review handout on ablation

## 2016-01-26 NOTE — Progress Notes (Signed)
Subjective:     Patient ID: [name], female   DOB: 08-Jun-1970, 45 y.o.   MRN: [id number]  HPI [name] is a 45 year old white female in to discuss periods, had 15 days of bleeding in October and 19 in November, spotting started last night.  Review of Systems Heavy long periods  Reviewed past medical,surgical, social and family history. Reviewed medications and allergies.     Objective:   Physical Exam BP (!) 148/78   Pulse 95   Ht 5' 6.75" (1.695 m)   Wt 264 lb (119.7 kg)   LMP 12/29/2015   BMI 41.66 kg/m PHQ 2 score 0.Talk only, she stopped Prometrium and estrace.Will rx megace and proceed with getting ablation.    Assessment:     1. Menorrhagia with irregular cycle       Plan:     Meds ordered this encounter  Medications  . megestrol (MEGACE) 40 MG tablet    Sig: take 3 x 5 days then 2 x 5 days then 1 daily with 1 refill    Dispense:  45 tablet    Refill:  1    Order Specific Question:   Supervising Provider    Answer:   Florian Buff [[id number]]     Follow up 12/22 for pre op with Dr Elonda Husky, wants ablation 12/27 or so Review handout on ablation

## 2016-02-09 ENCOUNTER — Ambulatory Visit (INDEPENDENT_AMBULATORY_CARE_PROVIDER_SITE_OTHER): Payer: BLUE CROSS/BLUE SHIELD | Admitting: Obstetrics & Gynecology

## 2016-02-09 ENCOUNTER — Encounter: Payer: Self-pay | Admitting: Obstetrics & Gynecology

## 2016-02-09 VITALS — BP 136/72 | HR 82 | Ht 66.75 in | Wt 262.0 lb

## 2016-02-09 DIAGNOSIS — N921 Excessive and frequent menstruation with irregular cycle: Secondary | ICD-10-CM | POA: Diagnosis not present

## 2016-02-09 NOTE — Patient Instructions (Signed)
[name]  02/09/2016     @PREFPERIOPPHARMACY @   Your procedure is scheduled on  02/14/2016   Report to [facility] at  1100 A.M.  Call this number if you have problems the morning of surgery:  [phone]   Remember:  Do not eat food or drink liquids after midnight.  Take these medicines the morning of surgery with A SIP OF WATER  Xanax, bentyl, hydrocodone, levothyroxine, zantac.   Do not wear jewelry, make-up or nail polish.  Do not wear lotions, powders, or perfumes, or deoderant.  Do not shave 48 hours prior to surgery.  Men may shave face and neck.  Do not bring valuables to the hospital.  [facility] is not responsible for any belongings or valuables.  Contacts, dentures or bridgework may not be worn into surgery.  Leave your suitcase in the car.  After surgery it may be brought to your room.  For patients admitted to the hospital, discharge time will be determined by your treatment team.  Patients discharged the day of surgery will not be allowed to drive home.   Name and phone number of your driver:   family Special instructions:  none  Please read over the following fact sheets that you were given. Anesthesia Post-op Instructions and Care and Recovery After Surgery       Dilation and Curettage or Vacuum Curettage Dilation and curettage (D&C) and vacuum curettage are minor procedures. A D&C involves stretching (dilation) the cervix and scraping (curettage) the inside lining of the uterus (endometrium). During a D&C, tissue is gently scraped from the endometrium, starting from the top portion of the uterus down to the lowest part of the uterus (cervix). During a vacuum curettage, the lining and tissue in the uterus are removed with the use of gentle suction. Curettage may be performed to either diagnose or treat a problem. As a diagnostic procedure, curettage is performed to examine tissues from the uterus. A diagnostic curettage may  be done if you have:  Irregular bleeding in the uterus.  Bleeding with the development of clots.  Spotting between menstrual periods.  Prolonged menstrual periods or other abnormal bleeding.  Bleeding after menopause.  No menstrual period (amenorrhea).  A change in size and shape of the uterus.  Abnormal endometrial cells discovered during a Pap test. As a treatment procedure, curettage may be performed for the following reasons:  Removal of an IUD (intrauterine device).  Removal of retained placenta after giving birth.  Abortion.  Miscarriage.  Removal of endometrial polyps.  Removal of uncommon types of noncancerous lumps (fibroids). Tell a health care provider about:  Any allergies you have, including allergies to prescribed medicine or latex.  All medicines you are taking, including vitamins, herbs, eye drops, creams, and over-the-counter medicines. This is especially important if you take any blood-thinning medicine. Bring a list of all of your medicines to your appointment.  Any problems you or family members have had with anesthetic medicines.  Any blood disorders you have.  Any surgeries you have had.  Your medical history and any medical conditions you have.  Whether you are pregnant or may be pregnant.  Recent vaginal infections you have had.  Recent menstrual periods, bleeding problems you have had, and what form of birth control (contraception) you use. What are the risks? Generally, this is a safe procedure. However, problems may occur, including:  Infection.  Heavy  vaginal bleeding.  Allergic reactions to medicines.  Damage to the cervix or other structures or organs.  Development of scar tissue (adhesions) inside the uterus, which can cause abnormal amounts of menstrual bleeding. This may make it harder to get pregnant in the future.  A hole (perforation) or puncture in the uterine wall. This is rare. What happens before the  procedure? Staying hydrated  Follow instructions from your health care provider about hydration, which may include:  Up to 2 hours before the procedure - you may continue to drink clear liquids, such as water, clear fruit juice, black coffee, and plain tea. Eating and drinking restrictions  Follow instructions from your health care provider about eating and drinking, which may include:  8 hours before the procedure - stop eating heavy meals or foods such as meat, fried foods, or fatty foods.  6 hours before the procedure - stop eating light meals or foods, such as toast or cereal.  6 hours before the procedure - stop drinking milk or drinks that contain milk.  2 hours before the procedure - stop drinking clear liquids. If your health care provider told you to take your medicine(s) on the day of your procedure, take them with only a sip of water. Medicines  Ask your health care provider about:  Changing or stopping your regular medicines. This is especially important if you are taking diabetes medicines or blood thinners.  Taking medicines such as aspirin and ibuprofen. These medicines can thin your blood. Do not take these medicines before your procedure if your health care provider instructs you not to.  You may be given antibiotic medicine to help prevent infection. General instructions  For 24 hours before your procedure, do not:  Douche.  Use tampons.  Use medicines, creams, or suppositories in the vagina.  Have sexual intercourse.  You may be given a pregnancy test on the day of the procedure.  Plan to have someone take you home from the hospital or clinic.  You may have a blood or urine sample taken.  If you will be going home right after the procedure, plan to have someone with you for 24 hours. What happens during the procedure?  To reduce your risk of infection:  Your health care team will wash or sanitize their hands.  Your skin will be washed with  soap.  An IV tube will be inserted into one of your veins.  You will be given one of the following:  A medicine that numbs the area in and around the cervix (local anesthetic).  A medicine to make you fall asleep (general anesthetic).  You will lie down on your back, with your feet in foot rests (stirrups).  The size and position of your uterus will be checked.  A lubricated instrument (speculum or Sims retractor) will be inserted into the back side of your vagina. The speculum will be used to hold apart the walls of your vagina so your health care provider can see your cervix.  A tool (tenaculum) will be attached to the lip of the cervix to stabilize it.  Your cervix will be softened and dilated. This may be done by:  Taking a medicine.  Having tapered dilators or thin rods (laminaria) or gradual widening instruments (tapered dilators) inserted into your cervix.  A small, sharp, curved instrument (curette) will be used to scrape a small amount of tissue or cells from the endometrium or cervical canal. In some cases, gentle suction is applied with the curette.  The curette will then be removed. The cells will be taken to a lab for testing. The procedure may vary among health care providers and hospitals. What happens after the procedure?  You may have mild cramping, backache, pain, and light bleeding or spotting. You may pass small blood clots from your vagina.  You may have to wear compression stockings. These stockings help to prevent blood clots and reduce swelling in your legs.  Your blood pressure, heart rate, breathing rate, and blood oxygen level will be monitored until the medicines you were given have worn off. Summary  Dilation and curettage (D&C) involves stretching (dilation) the cervix and scraping (curettage) the inside lining of the uterus (endometrium).  After the procedure, you may have mild cramping, backache, pain, and light bleeding or spotting. You may pass  small blood clots from your vagina.  Plan to have someone take you home from the hospital or clinic. This information is not intended to replace advice given to you by your health care provider. Make sure you discuss any questions you have with your health care provider. Document Released: 02/04/2005 Document Revised: 10/22/2015 Document Reviewed: 10/22/2015 Elsevier Interactive Patient Education  2017 [employer]  Dilation and Curettage or Vacuum Curettage, Care After These instructions give you information about caring for yourself after your procedure. Your doctor may also give you more specific instructions. Call your doctor if you have any problems or questions after your procedure. Follow these instructions at home: Activity  Do not drive or use heavy machinery while taking prescription pain medicine.  For 24 hours after your procedure, avoid driving.  Take short walks often, followed by rest periods. Ask your doctor what activities are safe for you. After one or two days, you may be able to return to your normal activities.  Do not lift anything that is heavier than 10 lb (4.5 kg) until your doctor approves.  For at least 2 weeks, or as long as told by your doctor:  Do not douche.  Do not use tampons.  Do not have sex. General instructions  Take over-the-counter and prescription medicines only as told by your doctor. This is very important if you take blood thinning medicine.  Do not take baths, swim, or use a hot tub until your doctor approves. Take showers instead of baths.  Wear compression stockings as told by your doctor.  It is up to you to get the results of your procedure. Ask your doctor when your results will be ready.  Keep all follow-up visits as told by your doctor. This is important. Contact a doctor if:  You have very bad cramps that get worse or do not get better with medicine.  You have very bad pain in your belly (abdomen).  You cannot drink  fluids without throwing up (vomiting).  You get pain in a different part of the area between your belly and thighs (pelvis).  You have bad-smelling discharge from your vagina.  You have a rash. Get help right away if:  You are bleeding a lot from your vagina. A lot of bleeding means soaking more than one sanitary pad in an hour, for 2 hours in a row.  You have clumps of blood (blood clots) coming from your vagina.  You have a fever or chills.  Your belly feels very tender or hard.  You have chest pain.  You have trouble breathing.  You cough up blood.  You feel dizzy.  You feel light-headed.  You pass out (faint).  You have pain in your neck or shoulder area. Summary  Take short walks often, followed by rest periods. Ask your doctor what activities are safe for you. After one or two days, you may be able to return to your normal activities.  Do not lift anything that is heavier than 10 lb (4.5 kg) until your doctor approves.  Do not take baths, swim, or use a hot tub until your doctor approves. Take showers instead of baths.  Contact your doctor if you have any symptoms of infection, like bad-smelling discharge from your vagina. This information is not intended to replace advice given to you by your health care provider. Make sure you discuss any questions you have with your health care provider. Document Released: 11/14/2007 Document Revised: 10/23/2015 Document Reviewed: 10/23/2015 Elsevier Interactive Patient Education  2017 [place].  Endometrial Ablation Endometrial ablation removes the lining of the uterus (endometrium). It is usually a same-day, outpatient treatment. Ablation helps avoid major surgery, such as surgery to remove the cervix and uterus (hysterectomy). After endometrial ablation, you will have little or no menstrual bleeding and may not be able to have children. However, if you are premenopausal, you will need to use a reliable method of birth  control following the procedure because of the small chance that pregnancy can occur. There are different reasons to have this procedure. These reasons include:  Heavy periods.  Bleeding that is causing anemia.  Irregular bleeding.  Bleeding fibroids on the lining inside the uterus if they are smaller than 3 centimeters. This procedure may not be possible for you if:   You want to have children in the future.   You have severe cramps with your menstrual period.   You have precancerous or cancerous cells in your uterus.   You were recently pregnant.   You have gone through menopause.   You have had major surgery on your uterus, resulting in thinning of the uterine wall. Surgeries may include:  The removal of one or more uterine fibroids (myomectomy).  A cesarean section with a classic (vertical) incision on your uterus. Ask your health care provider what type of cesarean you had. Sometimes the scar on your skin is different than the scar on your uterus. Even if you have had surgery on your uterus, certain types of ablation may still be safe for you. Talk with your health care provider. LET [facility] CARE PROVIDER KNOW ABOUT:  Any allergies you have.  All medicines you are taking, including vitamins, herbs, eye drops, creams, and over-the-counter medicines.  Previous problems you or members of your family have had with the use of anesthetics.  Any blood disorders you have.  Previous surgeries you have had.  Medical conditions you have. RISKS AND COMPLICATIONS  Generally, this is a safe procedure. However, as with any procedure, complications can occur. Possible complications include:  Perforation of the uterus.  Bleeding.  Infection of the uterus, bladder, or vagina.  Injury to surrounding organs.  An air bubble to the lung (air embolus).  Pregnancy following the procedure.  Failure of the procedure to help the problem, requiring hysterectomy.  Decreased  ability to diagnose cancer in the lining of the uterus. BEFORE THE PROCEDURE  The lining of the uterus must be tested to make sure there is no pre-cancerous or cancer cells present.  An ultrasound may be performed to look at the size of the uterus and to check for abnormalities.  Medicines may be given to thin the lining of  the uterus. PROCEDURE  During the procedure, your health care provider will use a tool called a resectoscope to help see inside your uterus. There are different ways to remove the lining of your uterus.   Radiofrequency - This method uses a radiofrequency-alternating electric current to remove the lining of the uterus.  Cryotherapy - This method uses extreme cold to freeze the lining of the uterus.  Heated-Free Liquid - This method uses heated salt (saline) solution to remove the lining of the uterus.  Microwave - This method uses high-energy microwaves to heat up the lining of the uterus to remove it.  Thermal balloon - This method involves inserting a catheter with a balloon tip into the uterus. The balloon tip is filled with heated fluid to remove the lining of the uterus. AFTER THE PROCEDURE  After your procedure, do not have sexual intercourse or insert anything into your vagina until permitted by your health care provider. After the procedure, you may experience:  Cramps.  Vaginal discharge.  Frequent urination. This information is not intended to replace advice given to you by your health care provider. Make sure you discuss any questions you have with your health care provider. Document Released: 12/15/2003 Document Revised: 10/26/2014 Document Reviewed: 07/08/2012 Elsevier Interactive Patient Education  2017 [place]. Hysteroscopy Hysteroscopy is a procedure used for looking inside the womb (uterus). It may be done for various reasons, including:  To evaluate abnormal bleeding, fibroid (benign, noncancerous) tumors, polyps, scar tissue (adhesions),  and possibly cancer of the uterus.  To look for lumps (tumors) and other uterine growths.  To look for causes of why a woman cannot get pregnant (infertility), causes of recurrent loss of pregnancy (miscarriages), or a lost intrauterine device (IUD).  To perform a sterilization by blocking the fallopian tubes from inside the uterus. In this procedure, a thin, flexible tube with a tiny light and camera on the end of it (hysteroscope) is used to look inside the uterus. A hysteroscopy should be done right after a menstrual period to be sure you are not pregnant. LET [facility] CARE PROVIDER KNOW ABOUT:   Any allergies you have.  All medicines you are taking, including vitamins, herbs, eye drops, creams, and over-the-counter medicines.  Previous problems you or members of your family have had with the use of anesthetics.  Any blood disorders you have.  Previous surgeries you have had.  Medical conditions you have. RISKS AND COMPLICATIONS  Generally, this is a safe procedure. However, as with any procedure, complications can occur. Possible complications include:  Putting a hole in the uterus.  Excessive bleeding.  Infection.  Damage to the cervix.  Injury to other organs.  Allergic reaction to medicines.  Too much fluid used in the uterus for the procedure. BEFORE THE PROCEDURE   Ask your health care provider about changing or stopping any regular medicines.  Do not take aspirin or blood thinners for 1 week before the procedure, or as directed by your health care provider. These can cause bleeding.  If you smoke, do not smoke for 2 weeks before the procedure.  In some cases, a medicine is placed in the cervix the day before the procedure. This medicine makes the cervix have a larger opening (dilate). This makes it easier for the instrument to be inserted into the uterus during the procedure.  Do not eat or drink anything for at least 8 hours before the surgery.  Arrange  for someone to take you home after the procedure.  PROCEDURE   You may be given a medicine to relax you (sedative). You may also be given one of the following:  A medicine that numbs the area around the cervix (local anesthetic).  A medicine that makes you sleep through the procedure (general anesthetic).  The hysteroscope is inserted through the vagina into the uterus. The camera on the hysteroscope sends a picture to a TV screen. This gives the surgeon a good view inside the uterus.  During the procedure, air or a liquid is put into the uterus, which allows the surgeon to see better.  Sometimes, tissue is gently scraped from inside the uterus. These tissue samples are sent to a lab for testing. AFTER THE PROCEDURE   If you had a general anesthetic, you may be groggy for a couple hours after the procedure.  If you had a local anesthetic, you will be able to go home as soon as you are stable and feel ready.  You may have some cramping. This normally lasts for a couple days.  You may have bleeding, which varies from light spotting for a few days to menstrual-like bleeding for 3-7 days. This is normal.  If your test results are not back during the visit, make an appointment with your health care provider to find out the results. This information is not intended to replace advice given to you by your health care provider. Make sure you discuss any questions you have with your health care provider. Document Released: 05/13/2000 Document Revised: 11/25/2012 Document Reviewed: 09/03/2012 Elsevier Interactive Patient Education  2017 [place]. Hysteroscopy, Care After Refer to this sheet in the next few weeks. These instructions provide you with information on caring for yourself after your procedure. Your health care provider may also give you more specific instructions. Your treatment has been planned according to current medical practices, but problems sometimes occur. Call your health  care provider if you have any problems or questions after your procedure.  WHAT TO EXPECT AFTER THE PROCEDURE After your procedure, it is typical to have the following:  You may have some cramping. This normally lasts for a couple days.  You may have bleeding. This can vary from light spotting for a few days to menstrual-like bleeding for 3-7 days. HOME CARE INSTRUCTIONS  Rest for the first 1-2 days after the procedure.  Only take over-the-counter or prescription medicines as directed by your health care provider. Do not take aspirin. It can increase the chances of bleeding.  Take showers instead of baths for 2 weeks or as directed by your health care provider.  Do not drive for 24 hours or as directed.  Do not drink alcohol while taking pain medicine.  Do not use tampons, douche, or have sexual intercourse for 2 weeks or until your health care provider says it is okay.  Take your temperature twice a day for 4-5 days. Write it down each time.  Follow your health care provider's advice about diet, exercise, and lifting.  If you develop constipation, you may:  Take a mild laxative if your health care provider approves.  Add bran foods to your diet.  Drink enough fluids to keep your urine clear or pale yellow.  Try to have someone with you or available to you for the first 24-48 hours, especially if you were given a general anesthetic.  Follow up with your health care provider as directed. SEEK MEDICAL CARE IF:  You feel dizzy or lightheaded.  You feel sick to your stomach (nauseous).  You have abnormal vaginal discharge.  You have a rash.  You have pain that is not controlled with medicine. SEEK IMMEDIATE MEDICAL CARE IF:  You have bleeding that is heavier than a normal menstrual period.  You have a fever.  You have increasing cramps or pain, not controlled with medicine.  You have new belly (abdominal) pain.  You pass out.  You have pain in the tops of your  shoulders (shoulder strap areas).  You have shortness of breath. This information is not intended to replace advice given to you by your health care provider. Make sure you discuss any questions you have with your health care provider. Document Released: 11/25/2012 Document Reviewed: 11/25/2012 Elsevier Interactive Patient Education  2017 [place] Anesthesia, Adult General anesthesia is the use of medicines to make a person "go to sleep" (be unconscious) for a medical procedure. General anesthesia is often recommended when a procedure:  Is long.  Requires you to be still or in an unusual position.  Is major and can cause you to lose blood.  Is impossible to do without general anesthesia. The medicines used for general anesthesia are called general anesthetics. In addition to making you sleep, the medicines:  Prevent pain.  Control your blood pressure.  Relax your muscles. Tell a health care provider about:  Any allergies you have.  All medicines you are taking, including vitamins, herbs, eye drops, creams, and over-the-counter medicines.  Any problems you or family members have had with anesthetic medicines.  Types of anesthetics you have had in the past.  Any bleeding disorders you have.  Any surgeries you have had.  Any medical conditions you have.  Any history of heart or lung conditions, such as heart failure, sleep apnea, or chronic obstructive pulmonary disease (COPD).  Whether you are pregnant or may be pregnant.  Whether you use tobacco, alcohol, marijuana, or street drugs.  Any history of Armed forces logistics/support/administrative officer.  Any history of depression or anxiety. What are the risks? Generally, this is a safe procedure. However, problems may occur, including:  Allergic reaction to anesthetics.  Lung and heart problems.  Inhaling food or liquids from your stomach into your lungs (aspiration).  Injury to nerves.  Waking up during your procedure and being  unable to move (rare).  Extreme agitation or a state of mental confusion (delirium) when you wake up from the anesthetic.  Air in the bloodstream, which can lead to stroke. These problems are more likely to develop if you are having a major surgery or if you have an advanced medical condition. You can prevent some of these complications by answering all of your health care provider's questions thoroughly and by following all pre-procedure instructions. General anesthesia can cause side effects, including:  Nausea or vomiting  A sore throat from the breathing tube.  Feeling cold or shivery.  Feeling tired, washed out, or achy.  Sleepiness or drowsiness.  Confusion or agitation. What happens before the procedure? Staying hydrated  Follow instructions from your health care provider about hydration, which may include:  Up to 2 hours before the procedure - you may continue to drink clear liquids, such as water, clear fruit juice, black coffee, and plain tea. Eating and drinking restrictions  Follow instructions from your health care provider about eating and drinking, which may include:  8 hours before the procedure - stop eating heavy meals or foods such as meat, fried foods, or fatty foods.  6 hours before the procedure - stop eating  light meals or foods, such as toast or cereal.  6 hours before the procedure - stop drinking milk or drinks that contain milk.  2 hours before the procedure - stop drinking clear liquids. Medicines  Ask your health care provider about:  Changing or stopping your regular medicines. This is especially important if you are taking diabetes medicines or blood thinners.  Taking medicines such as aspirin and ibuprofen. These medicines can thin your blood. Do not take these medicines before your procedure if your health care provider instructs you not to.  Taking new dietary supplements or medicines. Do not take these during the week before your procedure  unless your health care provider approves them.  If you are told to take a medicine or to continue taking a medicine on the day of the procedure, take the medicine with sips of water. General instructions   Ask if you will be going home the same day, the following day, or after a longer hospital stay.  Plan to have someone take you home.  Plan to have someone stay with you for the first 24 hours after you leave the hospital or clinic.  For 3-6 weeks before the procedure, try not to use any tobacco products, such as cigarettes, chewing tobacco, and e-cigarettes.  You may brush your teeth on the morning of the procedure, but make sure to spit out the toothpaste. What happens during the procedure?  You will be given anesthetics through a mask and through an IV tube in one of your veins.  You may receive medicine to help you relax (sedative).  As soon as you are asleep, a breathing tube may be used to help you breathe.  An anesthesia specialist will stay with you throughout the procedure. He or she will help keep you comfortable and safe by continuing to give you medicines and adjusting the amount of medicine that you get. He or she will also watch your blood pressure, pulse, and oxygen levels to make sure that the anesthetics do not cause any problems.  If a breathing tube was used to help you breathe, it will be removed before you wake up. The procedure may vary among health care providers and hospitals. What happens after the procedure?  You will wake up, often slowly, after the procedure is complete, usually in a recovery area.  Your blood pressure, heart rate, breathing rate, and blood oxygen level will be monitored until the medicines you were given have worn off.  You may be given medicine to help you calm down if you feel anxious or agitated.  If you will be going home the same day, your health care provider may check to make sure you can stand, drink, and urinate.  Your  health care providers will treat your pain and side effects before you go home.  Do not drive for 24 hours if you received a sedative.  You may:  Feel nauseous and vomit.  Have a sore throat.  Have mental slowness.  Feel cold or shivery.  Feel sleepy.  Feel tired.  Feel sore or achy, even in parts of your body where you did not have surgery. This information is not intended to replace advice given to you by your health care provider. Make sure you discuss any questions you have with your health care provider. Document Released: 05/14/2007 Document Revised: 07/18/2015 Document Reviewed: 01/19/2015 Elsevier Interactive Patient Education  2017 [place] Anesthesia, Adult, Care After These instructions provide you with information about caring  for yourself after your procedure. Your health care provider may also give you more specific instructions. Your treatment has been planned according to current medical practices, but problems sometimes occur. Call your health care provider if you have any problems or questions after your procedure. What can I expect after the procedure? After the procedure, it is common to have:  Vomiting.  A sore throat.  Mental slowness. It is common to feel:  Nauseous.  Cold or shivery.  Sleepy.  Tired.  Sore or achy, even in parts of your body where you did not have surgery. Follow these instructions at home: For at least 24 hours after the procedure:  Do not:  Participate in activities where you could fall or become injured.  Drive.  Use heavy machinery.  Drink alcohol.  Take sleeping pills or medicines that cause drowsiness.  Make important decisions or sign legal documents.  Take care of children on your own.  Rest. Eating and drinking  If you vomit, drink water, juice, or soup when you can drink without vomiting.  Drink enough fluid to keep your urine clear or pale yellow.  Make sure you have little or no  nausea before eating solid foods.  Follow the diet recommended by your health care provider. General instructions  Have a responsible adult stay with you until you are awake and alert.  Return to your normal activities as told by your health care provider. Ask your health care provider what activities are safe for you.  Take over-the-counter and prescription medicines only as told by your health care provider.  If you smoke, do not smoke without supervision.  Keep all follow-up visits as told by your health care provider. This is important. Contact a health care provider if:  You continue to have nausea or vomiting at home, and medicines are not helpful.  You cannot drink fluids or start eating again.  You cannot urinate after 8-12 hours.  You develop a skin rash.  You have fever.  You have increasing redness at the site of your procedure. Get help right away if:  You have difficulty breathing.  You have chest pain.  You have unexpected bleeding.  You feel that you are having a life-threatening or urgent problem. This information is not intended to replace advice given to you by your health care provider. Make sure you discuss any questions you have with your health care provider. Document Released: 05/13/2000 Document Revised: 07/10/2015 Document Reviewed: 01/19/2015 Elsevier Interactive Patient Education  2017 [employer].

## 2016-02-09 NOTE — Progress Notes (Signed)
Preoperative History and Physical  [name] [id number] a 45 y.o.G0P0000 with No LMP recorded. Patient is having increasingly heavy and prolonged periods, last 2 months, 15 and 19 days.  They have been heavy and painful for quite some timeadmitted for a hysteroscopy uterine curettage endometrial ablation.    PMH:      Past Medical History:  Diagnosis Date  . Anxiety   . Diverticulitis   . Hypercholesteremia   . Hypothyroidism   . Tobacco abuse 12/02/2012    PSH:       Past Surgical History:  Procedure Laterality Date  . CHOLECYSTECTOMY  2008   [place] N/A 02/05/2013   Procedure: COLONOSCOPY; Surgeon: Rogene Houston, MD; Location: [facility]; Service: Endoscopy; Laterality: N/A; 320-moved to 71 Ann notified pt    POb/GynH:          OB History   Gravida Para Term Preterm AB Living   0 0 0 0 0 0   SAB TAB Ectopic Multiple Live Births   0 0 0 0 0      SH:      Social History  Substance Use Topics  . Smoking status: Current Every Day Smoker    Packs/day: 0.50    Years: 31.00    Types: Cigarettes  . Smokeless tobacco: Never Used  . Alcohol use No    FH:        Family History  Problem Relation Age of Onset  . Hypertension Mother   . Prostate cancer Father   . Diverticulitis Father   . Prostate cancer Brother   . Alzheimer's disease Paternal Grandfather   . Other Paternal Grandmother     brain tumor  . Alzheimer's disease Maternal Grandmother   . Heart attack Maternal Grandfather      Allergies:No Known Allergies  Medications:  Current Outpatient Prescriptions:  .ALPRAZolam (XANAX) 1 MG tablet, Take 0.5-1 mg by mouth 3 (three) times daily as needed for anxiety. , Disp: , Rfl:  .atorvastatin (LIPITOR) 20 MG tablet, Take 20 mg by mouth daily., Disp: , Rfl: 11 .Cholecalciferol (VITAMIN D PO), Take 5,000 mg by mouth 2 (two) times  daily., Disp: , Rfl:  .cyclobenzaprine (FLEXERIL) 10 MG tablet, ORAL TAKE ONE TABLET AT BEDTIME PRN FOR NECK AND SHOULDER TIGHTNESS., Disp: , Rfl: 11 .dicyclomine (BENTYL) 10 MG capsule, Take 1 capsule (10 mg total) by mouth 3 (three) times daily before meals., Disp: 90 capsule, Rfl: 1 .HYDROcodone-acetaminophen (NORCO) 10-325 MG tablet, TAKE 1/2-1 TABLET UP TO 4 TIMES A DAY PRN FOR SEVERE PAIN., Disp: , Rfl: 0 .levothyroxine (SYNTHROID, LEVOTHROID) 175 MCG tablet, Take 175 mcg by mouth daily before breakfast., Disp: , Rfl:  .megestrol (MEGACE) 40 MG tablet, take 3 x 5 days then 2 x 5 days then 1 daily with 1 refill, Disp: 45 tablet, Rfl: 1 .metFORMIN (GLUCOPHAGE) 500 MG tablet, Take 1,000 mg by mouth 2 (two) times daily with a meal., Disp: , Rfl:  .Multiple Vitamins-Calcium (ONE-A-DAY WOMENS FORMULA) TABS, Take 1 tablet by mouth daily. , Disp: , Rfl:  .Omega-3 Fatty Acids (FISH OIL PO), Take by mouth daily., Disp: , Rfl:  .ranitidine (ZANTAC) 150 MG tablet, Take 150 mg by mouth daily. , Disp: , Rfl:   Review of Systems:  Review of Systems  Constitutional: Negative for fever, chills, weight loss, malaise/fatigue and diaphoresis.  HENT: Negative for hearing loss, ear pain, nosebleeds, congestion, sore throat, neck pain, tinnitus and ear discharge.  Eyes: Negative for blurred vision,  double vision, photophobia, pain, discharge and redness.  Respiratory: Negative for cough, hemoptysis, sputum production, shortness of breath, wheezing and stridor.  Cardiovascular: Negative for chest pain, palpitations, orthopnea, claudication, leg swelling and PND.  Gastrointestinal: Positive for abdominal pain. Negative for heartburn, nausea, vomiting, diarrhea, constipation, blood in stool and melena.  Genitourinary: Negative for dysuria, urgency, frequency, hematuria and flank pain.  Musculoskeletal: Negative for myalgias, back pain, joint pain and falls.  Skin: Negative for itching and  rash.  Neurological: Negative for dizziness, tingling, tremors, sensory change, speech change, focal weakness, seizures, loss of consciousness, weakness and headaches.  Endo/Heme/Allergies: Negative for environmental allergies and polydipsia. Does not bruise/bleed easily.  Psychiatric/Behavioral: Negative for depression, suicidal ideas, hallucinations, memory loss and substance abuse. The patient is not nervous/anxious and does not have insomnia.     PHYSICAL EXAM:  Blood pressure 136/72, pulse 82, height 5' 6.75" (1.695 m), weight 262 lb (118.8 kg).   Vitals reviewed. Constitutional: She is oriented to person, place, and time. She appears well-developed and well-nourished.  HENT:  Head: Normocephalic and atraumatic.  Right Ear: External ear normal.  Left Ear: External ear normal.  Nose: Nose normal.  Mouth/Throat: Oropharynx is clear and moist.  Eyes: Conjunctivae and EOM are normal. Pupils are equal, round, and reactive to light. Right eye exhibits no discharge. Left eye exhibits no discharge. No scleral icterus.  Neck: Normal range of motion. Neck supple. No tracheal deviation present. No thyromegaly present.  Cardiovascular: Normal rate, regular rhythm, normal heart sounds and intact distal pulses. Exam reveals no gallop and no friction rub.  No murmur heard. Respiratory: Effort normal and breath sounds normal. No respiratory distress. She has no wheezes. She has no rales. She exhibits no tenderness.  GI: Soft. Bowel sounds are normal. She exhibits no distension and no mass. There is tenderness. There is no rebound and no guarding.  Genitourinary:  Vulva is normal without lesions Vagina is pink moist without discharge Cervix normal in appearance and pap is normal Uterus is normal size, contour, position, consistency, mobility, non-tender Adnexa is negative with normal sized ovaries by sonogram Musculoskeletal: Normal range of motion. She exhibits no edema and no  tenderness.  Neurological: She is alert and oriented to person, place, and time. She has normal reflexes. She displays normal reflexes. No cranial nerve deficit. She exhibits normal muscle tone. Coordination normal.  Skin: Skin is warm and dry. No rash noted. No erythema. No pallor.  Psychiatric: She has a normal mood and affect. Her behavior is normal. Judgment and thought content normal.    Labs: No results found for this or any previous visit (from the past 336 hour(s)).  EKG: No orders found for this or any previous visit.  Imaging Studies: ImagingResults  No results found.      Assessment: menometrorrhaiga     Patient Active Problem List   Diagnosis Date Noted  . Acute diverticulitis 12/02/2012  . Leukocytosis, unspecified 12/02/2012  . Hyponatremia 12/02/2012  . Hypokalemia 12/02/2012  . Hyperglycemia 12/02/2012  . Obesity, unspecified 12/02/2012  . Sepsis ([place]) 12/02/2012  . Diverticulitis of colon with perforation 12/02/2012  . Hypothyroidism 12/02/2012  . Morbid obesity ([place]) 12/02/2012  . Tobacco abuse 12/02/2012  . Thyroid disease   . Anxiety     Plan: Hysteroscopy uterine curettage endometrial ablation 02/14/2016  EURE,LUTHER H

## 2016-02-13 ENCOUNTER — Encounter (HOSPITAL_COMMUNITY)
Admission: RE | Admit: 2016-02-13 | Discharge: 2016-02-13 | Disposition: A | Discharge status: Home / Self Care | Payer: BLUE CROSS/BLUE SHIELD | Source: Ambulatory Visit | Attending: Obstetrics & Gynecology | Admitting: Obstetrics & Gynecology

## 2016-02-13 ENCOUNTER — Encounter (HOSPITAL_COMMUNITY): Payer: Self-pay

## 2016-02-13 DIAGNOSIS — Z8719 Personal history of other diseases of the digestive system: Secondary | ICD-10-CM | POA: Diagnosis not present

## 2016-02-13 DIAGNOSIS — F419 Anxiety disorder, unspecified: Secondary | ICD-10-CM | POA: Diagnosis not present

## 2016-02-13 DIAGNOSIS — F17210 Nicotine dependence, cigarettes, uncomplicated: Secondary | ICD-10-CM | POA: Diagnosis not present

## 2016-02-13 DIAGNOSIS — K219 Gastro-esophageal reflux disease without esophagitis: Secondary | ICD-10-CM | POA: Diagnosis not present

## 2016-02-13 DIAGNOSIS — E119 Type 2 diabetes mellitus without complications: Secondary | ICD-10-CM | POA: Diagnosis not present

## 2016-02-13 DIAGNOSIS — N946 Dysmenorrhea, unspecified: Secondary | ICD-10-CM | POA: Diagnosis not present

## 2016-02-13 DIAGNOSIS — Z6841 Body Mass Index (BMI) 40.0 and over, adult: Secondary | ICD-10-CM | POA: Diagnosis not present

## 2016-02-13 DIAGNOSIS — E039 Hypothyroidism, unspecified: Secondary | ICD-10-CM | POA: Diagnosis not present

## 2016-02-13 DIAGNOSIS — E7800 Pure hypercholesterolemia, unspecified: Secondary | ICD-10-CM | POA: Diagnosis not present

## 2016-02-13 DIAGNOSIS — N921 Excessive and frequent menstruation with irregular cycle: Secondary | ICD-10-CM | POA: Diagnosis present

## 2016-02-13 DIAGNOSIS — N840 Polyp of corpus uteri: Secondary | ICD-10-CM | POA: Diagnosis not present

## 2016-02-13 DIAGNOSIS — Z7984 Long term (current) use of oral hypoglycemic drugs: Secondary | ICD-10-CM | POA: Diagnosis not present

## 2016-02-13 HISTORY — DX: Gastro-esophageal reflux disease without esophagitis: K21.9

## 2016-02-13 HISTORY — DX: Type 2 diabetes mellitus without complications: E11.9

## 2016-02-13 LAB — COMPREHENSIVE METABOLIC PANEL
ALT: 49 U/L (ref 14–54)
ANION GAP: 9 (ref 5–15)
AST: 46 U/L — AB (ref 15–41)
Albumin: 3.9 g/dL (ref 3.5–5.0)
Alkaline Phosphatase: 65 U/L (ref 38–126)
BILIRUBIN TOTAL: 0.5 mg/dL (ref 0.3–1.2)
BUN: 11 mg/dL (ref 6–20)
CHLORIDE: 107 mmol/L (ref 101–111)
CO2: 18 mmol/L — ABNORMAL LOW (ref 22–32)
Calcium: 9.3 mg/dL (ref 8.9–10.3)
Creatinine, Ser: 0.62 mg/dL (ref 0.44–1.00)
GFR calc Af Amer: >60 mL/min (ref >60)
GFR calc non Af Amer: >60 mL/min (ref >60)
Glucose, Bld: 239 mg/dL — ABNORMAL HIGH (ref 65–99)
POTASSIUM: 4.2 mmol/L (ref 3.5–5.1)
Sodium: 134 mmol/L — ABNORMAL LOW (ref 135–145)
TOTAL PROTEIN: 7.2 g/dL (ref 6.5–8.1)

## 2016-02-13 LAB — CBC
HEMATOCRIT: 43.3 % (ref 36.0–46.0)
Hemoglobin: 14.4 g/dL (ref 12.0–15.0)
MCH: 30.6 pg (ref 26.0–34.0)
MCHC: 33.3 g/dL (ref 30.0–36.0)
MCV: 92.1 fL (ref 78.0–100.0)
PLATELETS: 231 10*3/uL (ref 150–400)
RBC: 4.7 MIL/uL (ref 3.87–5.11)
RDW: 13.5 % (ref 11.5–15.5)
WBC: 8.7 10*3/uL (ref 4.0–10.5)

## 2016-02-13 LAB — HCG, QUANTITATIVE, PREGNANCY

## 2016-02-13 LAB — URINALYSIS, ROUTINE W REFLEX MICROSCOPIC
BILIRUBIN URINE: NEGATIVE
GLUCOSE, UA: 150 mg/dL — AB
KETONES UR: NEGATIVE mg/dL
NITRITE: NEGATIVE
PH: 7 (ref 5.0–8.0)
PROTEIN: NEGATIVE mg/dL
Specific Gravity, Urine: 1.011 (ref 1.005–1.030)

## 2016-02-14 ENCOUNTER — Ambulatory Visit (HOSPITAL_COMMUNITY): Payer: BLUE CROSS/BLUE SHIELD | Admitting: Anesthesiology

## 2016-02-14 ENCOUNTER — Encounter (HOSPITAL_COMMUNITY): Payer: Self-pay | Admitting: Anesthesiology

## 2016-02-14 ENCOUNTER — Encounter (HOSPITAL_COMMUNITY)
Admission: RE | Disposition: A | Discharge status: Home / Self Care | Payer: Self-pay | Source: Ambulatory Visit | Attending: Obstetrics & Gynecology

## 2016-02-14 ENCOUNTER — Ambulatory Visit (HOSPITAL_COMMUNITY)
Admission: RE | Admit: 2016-02-14 | Discharge: 2016-02-14 | Disposition: A | Discharge status: Home / Self Care | Payer: BLUE CROSS/BLUE SHIELD | Source: Ambulatory Visit | Attending: Obstetrics & Gynecology | Admitting: Obstetrics & Gynecology

## 2016-02-14 DIAGNOSIS — N840 Polyp of corpus uteri: Secondary | ICD-10-CM

## 2016-02-14 DIAGNOSIS — Z6841 Body Mass Index (BMI) 40.0 and over, adult: Secondary | ICD-10-CM | POA: Insufficient documentation

## 2016-02-14 DIAGNOSIS — N921 Excessive and frequent menstruation with irregular cycle: Secondary | ICD-10-CM | POA: Diagnosis not present

## 2016-02-14 DIAGNOSIS — K219 Gastro-esophageal reflux disease without esophagitis: Secondary | ICD-10-CM | POA: Insufficient documentation

## 2016-02-14 DIAGNOSIS — F17210 Nicotine dependence, cigarettes, uncomplicated: Secondary | ICD-10-CM | POA: Insufficient documentation

## 2016-02-14 DIAGNOSIS — E7800 Pure hypercholesterolemia, unspecified: Secondary | ICD-10-CM | POA: Insufficient documentation

## 2016-02-14 DIAGNOSIS — Z7984 Long term (current) use of oral hypoglycemic drugs: Secondary | ICD-10-CM | POA: Insufficient documentation

## 2016-02-14 DIAGNOSIS — E039 Hypothyroidism, unspecified: Secondary | ICD-10-CM | POA: Insufficient documentation

## 2016-02-14 DIAGNOSIS — F419 Anxiety disorder, unspecified: Secondary | ICD-10-CM | POA: Insufficient documentation

## 2016-02-14 DIAGNOSIS — E119 Type 2 diabetes mellitus without complications: Secondary | ICD-10-CM | POA: Insufficient documentation

## 2016-02-14 DIAGNOSIS — N946 Dysmenorrhea, unspecified: Secondary | ICD-10-CM | POA: Insufficient documentation

## 2016-02-14 DIAGNOSIS — Z8719 Personal history of other diseases of the digestive system: Secondary | ICD-10-CM | POA: Insufficient documentation

## 2016-02-14 HISTORY — PX: DILITATION & CURRETTAGE/HYSTROSCOPY WITH NOVASURE ABLATION: SHX5568

## 2016-02-14 LAB — GLUCOSE, CAPILLARY
Glucose-Capillary: 188 mg/dL — ABNORMAL HIGH (ref 65–99)
Glucose-Capillary: 172 mg/dL — ABNORMAL HIGH (ref 65–99)

## 2016-02-14 SURGERY — DILATATION & CURETTAGE/HYSTEROSCOPY WITH NOVASURE ABLATION
Anesthesia: General | Site: Vagina

## 2016-02-14 MED ORDER — LIDOCAINE HCL (CARDIAC) 10 MG/ML IV SOLN
INTRAVENOUS | Status: DC | PRN
  Administered 2016-02-14: 40 mg via INTRAVENOUS

## 2016-02-14 MED ORDER — LACTATED RINGERS IV SOLN
INTRAVENOUS | Status: DC
  Administered 2016-02-14: 12:00:00 via INTRAVENOUS

## 2016-02-14 MED ORDER — DEXAMETHASONE SODIUM PHOSPHATE 4 MG/ML IJ SOLN
INTRAMUSCULAR | Status: DC | PRN
  Administered 2016-02-14: 4 mg via INTRAVENOUS

## 2016-02-14 MED ORDER — FENTANYL CITRATE (PF) 100 MCG/2ML IJ SOLN
INTRAMUSCULAR | Status: DC | PRN
  Administered 2016-02-14: 100 ug via INTRAVENOUS

## 2016-02-14 MED ORDER — LIDOCAINE HCL (PF) 1 % IJ SOLN
INTRAMUSCULAR | Status: AC
  Filled 2016-02-14: qty 5

## 2016-02-14 MED ORDER — DEXAMETHASONE SODIUM PHOSPHATE 4 MG/ML IJ SOLN
INTRAMUSCULAR | Status: AC
  Filled 2016-02-14: qty 1

## 2016-02-14 MED ORDER — GLYCOPYRROLATE 0.2 MG/ML IJ SOLN
INTRAMUSCULAR | Status: AC
  Filled 2016-02-14: qty 1

## 2016-02-14 MED ORDER — KETOROLAC TROMETHAMINE 30 MG/ML IJ SOLN
INTRAMUSCULAR | Status: AC
  Filled 2016-02-14: qty 1

## 2016-02-14 MED ORDER — MIDAZOLAM HCL 2 MG/2ML IJ SOLN
1.0000 mg | INTRAMUSCULAR | Status: DC | PRN
  Administered 2016-02-14: 2 mg via INTRAVENOUS

## 2016-02-14 MED ORDER — FENTANYL CITRATE (PF) 100 MCG/2ML IJ SOLN
INTRAMUSCULAR | Status: AC
  Filled 2016-02-14: qty 2

## 2016-02-14 MED ORDER — CEFAZOLIN SODIUM-DEXTROSE 2-4 GM/100ML-% IV SOLN
2.0000 g | INTRAVENOUS | Status: AC
  Administered 2016-02-14: 2 g via INTRAVENOUS
  Filled 2016-02-14: qty 100

## 2016-02-14 MED ORDER — GLYCOPYRROLATE 0.2 MG/ML IJ SOLN
0.2000 mg | Freq: Once | INTRAMUSCULAR | Status: AC | PRN
  Administered 2016-02-14: 0.2 mg via INTRAVENOUS

## 2016-02-14 MED ORDER — MIDAZOLAM HCL 2 MG/2ML IJ SOLN
INTRAMUSCULAR | Status: AC
  Filled 2016-02-14: qty 2

## 2016-02-14 MED ORDER — SODIUM CHLORIDE 0.9 % IR SOLN
Status: DC | PRN
  Administered 2016-02-14: 3000 mL

## 2016-02-14 MED ORDER — ONDANSETRON HCL 4 MG/2ML IJ SOLN
INTRAMUSCULAR | Status: AC
  Filled 2016-02-14: qty 2

## 2016-02-14 MED ORDER — KETOROLAC TROMETHAMINE 30 MG/ML IJ SOLN
30.0000 mg | Freq: Once | INTRAMUSCULAR | Status: AC
  Administered 2016-02-14: 30 mg via INTRAVENOUS

## 2016-02-14 MED ORDER — KETOROLAC TROMETHAMINE 10 MG PO TABS: 10.0000 mg | ORAL_TABLET | Freq: Three times a day (TID) | ORAL | 0 refills | Status: AC | PRN

## 2016-02-14 MED ORDER — ONDANSETRON HCL 4 MG/2ML IJ SOLN
4.0000 mg | Freq: Once | INTRAMUSCULAR | Status: AC
  Administered 2016-02-14: 4 mg via INTRAVENOUS

## 2016-02-14 MED ORDER — PROPOFOL 10 MG/ML IV BOLUS
INTRAVENOUS | Status: AC
  Filled 2016-02-14: qty 20

## 2016-02-14 MED ORDER — FENTANYL CITRATE (PF) 100 MCG/2ML IJ SOLN
25.0000 ug | INTRAMUSCULAR | Status: DC | PRN
  Administered 2016-02-14: 25 ug via INTRAVENOUS

## 2016-02-14 MED ORDER — PROPOFOL 10 MG/ML IV BOLUS
INTRAVENOUS | Status: DC | PRN
  Administered 2016-02-14: 250 mg via INTRAVENOUS

## 2016-02-14 MED ORDER — ONDANSETRON HCL 8 MG PO TABS: 8.0000 mg | ORAL_TABLET | Freq: Three times a day (TID) | ORAL | 0 refills | Status: DC | PRN

## 2016-02-14 SURGICAL SUPPLY — 25 items
ABLATOR ENDOMETRIAL BIPOLAR (ABLATOR) ×3 IMPLANT
BAG HAMPER (MISCELLANEOUS) ×3 IMPLANT
CLOTH BEACON ORANGE TIMEOUT ST (SAFETY) ×3 IMPLANT
COVER LIGHT HANDLE STERIS (MISCELLANEOUS) ×6 IMPLANT
FORMALIN 10 PREFIL 120ML (MISCELLANEOUS) ×3 IMPLANT
GAUZE SPONGE 4X4 16PLY XRAY LF (GAUZE/BANDAGES/DRESSINGS) ×3 IMPLANT
GLOVE BIOGEL PI IND STRL 7.0 (GLOVE) ×2 IMPLANT
GLOVE BIOGEL PI IND STRL 8 (GLOVE) ×1 IMPLANT
GLOVE BIOGEL PI INDICATOR 7.0 (GLOVE) ×4
GLOVE BIOGEL PI INDICATOR 8 (GLOVE) ×2
GLOVE ECLIPSE 8.0 STRL XLNG CF (GLOVE) ×3 IMPLANT
GOWN STRL REUS W/TWL LRG LVL3 (GOWN DISPOSABLE) ×3 IMPLANT
GOWN STRL REUS W/TWL XL LVL3 (GOWN DISPOSABLE) ×3 IMPLANT
INST SET HYSTEROSCOPY (KITS) ×3 IMPLANT
IV NS IRRIG 3000ML ARTHROMATIC (IV SOLUTION) ×3 IMPLANT
KIT ROOM TURNOVER AP CYSTO (KITS) ×3 IMPLANT
MANIFOLD NEPTUNE II (INSTRUMENTS) ×3 IMPLANT
PACK BASIC III (CUSTOM PROCEDURE TRAY) ×3
PACK SRG BSC III STRL LF ECLPS (CUSTOM PROCEDURE TRAY) ×1 IMPLANT
PAD ARMBOARD 7.5X6 YLW CONV (MISCELLANEOUS) ×3 IMPLANT
PAD TELFA 3X4 1S STER (GAUZE/BANDAGES/DRESSINGS) ×3 IMPLANT
SET BASIN LINEN APH (SET/KITS/TRAYS/PACK) ×3 IMPLANT
SET IRRIG Y TYPE TUR BLADDER L (SET/KITS/TRAYS/PACK) ×3 IMPLANT
SHEET LAVH (DRAPES) ×3 IMPLANT
YANKAUER SUCT BULB TIP 10FT TU (MISCELLANEOUS) ×3 IMPLANT

## 2016-02-14 NOTE — Anesthesia Postprocedure Evaluation (Signed)
Anesthesia Post Note  Patient: [name]  Procedure(s) Performed: Procedure(s) (LRB): DILATATION & CURETTAGE/HYSTEROSCOPY WITH NOVASURE ABLATION (N/A)  Patient location during evaluation: PACU Anesthesia Type: General Level of consciousness: awake, awake and alert, oriented and patient cooperative Pain management: pain level controlled Vital Signs Assessment: post-procedure vital signs reviewed and stable Respiratory status: spontaneous breathing, nonlabored ventilation and respiratory function stable Cardiovascular status: stable Postop Assessment: no signs of nausea or vomiting Anesthetic complications: no     Last Vitals:  Vitals:   02/14/16 1230 02/14/16 1327  BP: 127/62 (P) 128/74  Pulse:    Resp: 16 (P) 12  Temp:  (P) 36.5 C    Last Pain:  Vitals:   02/14/16 1140  TempSrc: Oral                 Ahlaya Ende L

## 2016-02-14 NOTE — Anesthesia Preprocedure Evaluation (Signed)
Anesthesia Evaluation  Patient identified by MRN, date of birth, ID band Patient awake    Reviewed: Allergy & Precautions, NPO status , Patient's Chart, lab work & pertinent test results  Airway Mallampati: II  TM Distance: >3 FB     Dental  (+) Poor Dentition, Dental Advisory Given   Pulmonary Current Smoker,    breath sounds clear to auscultation       Cardiovascular negative cardio ROS   Rhythm:Regular Rate:Normal     Neuro/Psych Anxiety    GI/Hepatic GERD  Controlled and Medicated,  Endo/Other  diabetes, Type 2, Oral Hypoglycemic AgentsHypothyroidism Morbid obesity  Renal/GU      Musculoskeletal   Abdominal   Peds  Hematology   Anesthesia Other Findings   Reproductive/Obstetrics                             Anesthesia Physical Anesthesia Plan  ASA: II  Anesthesia Plan: General   Post-op Pain Management:    Induction: Intravenous  Airway Management Planned: LMA  Additional Equipment:   Intra-op Plan:   Post-operative Plan: Extubation in OR  Informed Consent: I have reviewed the patients History and Physical, chart, labs and discussed the procedure including the risks, benefits and alternatives for the proposed anesthesia with the patient or authorized representative who has indicated his/her understanding and acceptance.     Plan Discussed with:   Anesthesia Plan Comments:         Anesthesia Quick Evaluation

## 2016-02-14 NOTE — Op Note (Signed)
Preoperative diagnosis: Menometrorrhagia                                        Dysmenorrhea                                           Postoperative diagnoses: Same as above   Procedure: Hysteroscopy, uterine curettage, endometrial ablation using Novasure  Surgeon: Florian Buff   Anesthesia: Laryngeal mask airway  Findings: The endometrium was normal There were no fibroid or other abnormalities.  Description of operation: The patient was taken to the operating room and placed in the supine position. She underwent general anesthesia using the laryngeal mask airway. She was placed in the dorsal lithotomy position and prepped and draped in the usual sterile fashion. A Graves speculum was placed and the anterior cervical lip was grasped with a single-tooth tenaculum. The cervix was dilated serially to allow passage of the hysteroscope. Diagnostic hysteroscopy was performed and was found to be normal. A vigorous uterine curettage was then performed and all tissue sent to pathology for evaluation.  I then proceeded to perform the Novasure endometrial ablation.  The cervical length was 3.0. The uterus sounded to  8.5 cm yielding a net length of 5.5 cm.  The endometrial cavity was 4.2 cm wide. The power was 126 watts.  The total time of therapy was 1 min 3 seconds. The array was evaluated after the procedure and tissue was adherent on all the dimensions of the surface, confirming fundal treatment as well.    All of the equipment worked well throughout the procedure.  The patient was awakened from anesthesia and taken to the recovery room in good stable condition all counts were correct. She received 2 g of Ancef and 30 mg of Toradol preoperatively. She will be discharged from the recovery room and followed up in the office in 1- 2 weeks.  Teliah Buffalo H 02/14/2016 1:15 PM

## 2016-02-14 NOTE — Discharge Instructions (Signed)
Endometrial Ablation °Endometrial ablation removes the lining of the uterus (endometrium). It is usually a same-day, outpatient treatment. Ablation helps avoid major surgery, such as surgery to remove the cervix and uterus (hysterectomy). After endometrial ablation, you will have little or no menstrual bleeding and may not be able to have children. However, if you are premenopausal, you will need to use a reliable method of birth control following the procedure because of the small chance that pregnancy can occur. °There are different reasons to have this procedure. These reasons include: °· Heavy periods. °· Bleeding that is causing anemia. °· Irregular bleeding. °· Bleeding fibroids on the lining inside the uterus if they are smaller than 3 centimeters. °This procedure may not be possible for you if:  °· You want to have children in the future.   °· You have severe cramps with your menstrual period.   °· You have precancerous or cancerous cells in your uterus.   °· You were recently pregnant.   °· You have gone through menopause.   °· You have had major surgery on your uterus, resulting in thinning of the uterine wall. Surgeries may include: °¨ The removal of one or more uterine fibroids (myomectomy). °¨ A cesarean section with a classic (vertical) incision on your uterus. Ask your health care provider what type of cesarean you had. Sometimes the scar on your skin is different than the scar on your uterus. °Even if you have had surgery on your uterus, certain types of ablation may still be safe for you. Talk with your health care provider. °LET YOUR HEALTH CARE PROVIDER KNOW ABOUT: °· Any allergies you have. °· All medicines you are taking, including vitamins, herbs, eye drops, creams, and over-the-counter medicines. °· Previous problems you or members of your family have had with the use of anesthetics. °· Any blood disorders you have. °· Previous surgeries you have had. °· Medical conditions you have. °RISKS AND  COMPLICATIONS  °Generally, this is a safe procedure. However, as with any procedure, complications can occur. Possible complications include: °· Perforation of the uterus. °· Bleeding. °· Infection of the uterus, bladder, or vagina. °· Injury to surrounding organs. °· An air bubble to the lung (air embolus). °· Pregnancy following the procedure. °· Failure of the procedure to help the problem, requiring hysterectomy. °· Decreased ability to diagnose cancer in the lining of the uterus. °BEFORE THE PROCEDURE °· The lining of the uterus must be tested to make sure there is no pre-cancerous or cancer cells present. °· An ultrasound may be performed to look at the size of the uterus and to check for abnormalities. °· Medicines may be given to thin the lining of the uterus. °PROCEDURE  °During the procedure, your health care provider will use a tool called a resectoscope to help see inside your uterus. There are different ways to remove the lining of your uterus.  °· Radiofrequency - This method uses a radiofrequency-alternating electric current to remove the lining of the uterus. °· Cryotherapy - This method uses extreme cold to freeze the lining of the uterus. °· Heated-Free Liquid - This method uses heated salt (saline) solution to remove the lining of the uterus. °· Microwave - This method uses high-energy microwaves to heat up the lining of the uterus to remove it. °· Thermal balloon - This method involves inserting a catheter with a balloon tip into the uterus. The balloon tip is filled with heated fluid to remove the lining of the uterus. °AFTER THE PROCEDURE  °After your procedure, do   not have sexual intercourse or insert anything into your vagina until permitted by your health care provider. After the procedure, you may experience:  Cramps.  Vaginal discharge.  Frequent urination. This information is not intended to replace advice given to you by your health care provider. Make sure you discuss any  questions you have with your health care provider. Document Released: 12/15/2003 Document Revised: 10/26/2014 Document Reviewed: 07/08/2012 Elsevier Interactive Patient Education  2017 [employer]  

## 2016-02-14 NOTE — Anesthesia Procedure Notes (Signed)
Procedure Name: LMA Insertion Date/Time: 02/14/2016 12:55 PM Performed by: Raenette Rover Pre-anesthesia Checklist: Patient identified, Emergency Drugs available, Suction available and Patient being monitored Patient Re-evaluated:Patient Re-evaluated prior to inductionOxygen Delivery Method: Circle system utilized Preoxygenation: Pre-oxygenation with 100% oxygen Intubation Type: IV induction Ventilation: Mask ventilation without difficulty LMA: LMA inserted LMA Size: 4.0 Number of attempts: 1 Placement Confirmation: positive ETCO2,  CO2 detector and breath sounds checked- equal and bilateral Tube secured with: Tape Dental Injury: Teeth and Oropharynx as per pre-operative assessment

## 2016-02-14 NOTE — Transfer of Care (Signed)
Immediate Anesthesia Transfer of Care Note  Patient: [name]  Procedure(s) Performed: Procedure(s) with comments: DILATATION & CURETTAGE/HYSTEROSCOPY WITH NOVASURE ABLATION (N/A) - Novasure Length - 5.5 Width - 4.2 Power - 127 Time - 1 minute 3 seconds  Patient Location: PACU  Anesthesia Type:General  Level of Consciousness: awake, alert , oriented and patient cooperative  Airway & Oxygen Therapy: Patient Spontanous Breathing, Patient connected to nasal cannula oxygen and Patient connected to face mask oxygen  Post-op Assessment: Report given to RN and Post -op Vital signs reviewed and stable  Post vital signs: Reviewed and stable 136/78, 72 NSR, 98% Spo2 on FM O2  Last Vitals:  Vitals:   02/14/16 1225 02/14/16 1230  BP: 122/63 127/62  Pulse:    Resp: 16 16  Temp:      Last Pain:  Vitals:   02/14/16 1140  TempSrc: Oral      Patients Stated Pain Goal: 4 ([id number])  Complications: No apparent anesthesia complications

## 2016-02-14 NOTE — H&P (Signed)
Preoperative History and Physical  [name] is a 45 y.o. G0P0000 with No LMP recorded. Patient is having increasingly heavy and prolonged periods, last 2 months, 15 and 19 days.  They have been heavy and painful for quite some time admitted for a hysteroscopy uterine curettage endometrial ablation.    PMH:        Past Medical History:  Diagnosis Date  . Anxiety   . Diverticulitis   . Hypercholesteremia   . Hypothyroidism   . Tobacco abuse 12/02/2012    PSH:          Past Surgical History:  Procedure Laterality Date  . CHOLECYSTECTOMY  2008   [place] N/A 02/05/2013   Procedure: COLONOSCOPY;  Surgeon: Rogene Houston, MD;  Location: [facility];  Service: Endoscopy;  Laterality: N/A;  320-moved to 57 Ann notified pt    POb/GynH:              OB History    Gravida Para Term Preterm AB Living   0 0 0 0 0 0   SAB TAB Ectopic Multiple Live Births   0 0 0 0 0      SH:        Social History  Substance Use Topics  . Smoking status: Current Every Day Smoker    Packs/day: 0.50    Years: 31.00    Types: Cigarettes  . Smokeless tobacco: Never Used  . Alcohol use No    FH:          Family History  Problem Relation Age of Onset  . Hypertension Mother   . Prostate cancer Father   . Diverticulitis Father   . Prostate cancer Brother   . Alzheimer's disease Paternal Grandfather   . Other Paternal Grandmother     brain tumor  . Alzheimer's disease Maternal Grandmother   . Heart attack Maternal Grandfather      Allergies: No Known Allergies  Medications:       Current Outpatient Prescriptions:  .  ALPRAZolam (XANAX) 1 MG tablet, Take 0.5-1 mg by mouth 3 (three) times daily as needed for anxiety. , Disp: , Rfl:  .  atorvastatin (LIPITOR) 20 MG tablet, Take 20 mg by mouth daily., Disp: , Rfl: 11 .  Cholecalciferol (VITAMIN D PO), Take 5,000 mg by mouth 2 (two) times daily., Disp: , Rfl:  .   cyclobenzaprine (FLEXERIL) 10 MG tablet, ORAL TAKE ONE TABLET AT BEDTIME PRN FOR NECK AND SHOULDER TIGHTNESS., Disp: , Rfl: 11 .  dicyclomine (BENTYL) 10 MG capsule, Take 1 capsule (10 mg total) by mouth 3 (three) times daily before meals., Disp: 90 capsule, Rfl: 1 .  HYDROcodone-acetaminophen (NORCO) 10-325 MG tablet, TAKE 1/2-1 TABLET UP TO 4 TIMES A DAY PRN FOR SEVERE PAIN., Disp: , Rfl: 0 .  levothyroxine (SYNTHROID, LEVOTHROID) 175 MCG tablet, Take 175 mcg by mouth daily before breakfast., Disp: , Rfl:  .  megestrol (MEGACE) 40 MG tablet, take 3 x 5 days then 2 x 5 days then 1 daily with 1 refill, Disp: 45 tablet, Rfl: 1 .  metFORMIN (GLUCOPHAGE) 500 MG tablet, Take 1,000 mg by mouth 2 (two) times daily with a meal., Disp: , Rfl:  .  Multiple Vitamins-Calcium (ONE-A-DAY WOMENS FORMULA) TABS, Take 1 tablet by mouth daily. , Disp: , Rfl:  .  Omega-3 Fatty Acids (FISH OIL PO), Take by mouth daily., Disp: , Rfl:  .  ranitidine (ZANTAC) 150 MG tablet, Take 150  mg by mouth daily. , Disp: , Rfl:   Review of Systems:   Review of Systems  Constitutional: Negative for fever, chills, weight loss, malaise/fatigue and diaphoresis.  HENT: Negative for hearing loss, ear pain, nosebleeds, congestion, sore throat, neck pain, tinnitus and ear discharge.   Eyes: Negative for blurred vision, double vision, photophobia, pain, discharge and redness.  Respiratory: Negative for cough, hemoptysis, sputum production, shortness of breath, wheezing and stridor.   Cardiovascular: Negative for chest pain, palpitations, orthopnea, claudication, leg swelling and PND.  Gastrointestinal: Positive for abdominal pain. Negative for heartburn, nausea, vomiting, diarrhea, constipation, blood in stool and melena.  Genitourinary: Negative for dysuria, urgency, frequency, hematuria and flank pain.  Musculoskeletal: Negative for myalgias, back pain, joint pain and falls.  Skin: Negative for itching and rash.  Neurological:  Negative for dizziness, tingling, tremors, sensory change, speech change, focal weakness, seizures, loss of consciousness, weakness and headaches.  Endo/Heme/Allergies: Negative for environmental allergies and polydipsia. Does not bruise/bleed easily.  Psychiatric/Behavioral: Negative for depression, suicidal ideas, hallucinations, memory loss and substance abuse. The patient is not nervous/anxious and does not have insomnia.      PHYSICAL EXAM:  Blood pressure 136/72, pulse 82, height 5' 6.75" (1.695 m), weight 262 lb (118.8 kg).    Vitals reviewed. Constitutional: She is oriented to person, place, and time. She appears well-developed and well-nourished.  HENT:  Head: Normocephalic and atraumatic.  Right Ear: External ear normal.  Left Ear: External ear normal.  Nose: Nose normal.  Mouth/Throat: Oropharynx is clear and moist.  Eyes: Conjunctivae and EOM are normal. Pupils are equal, round, and reactive to light. Right eye exhibits no discharge. Left eye exhibits no discharge. No scleral icterus.  Neck: Normal range of motion. Neck supple. No tracheal deviation present. No thyromegaly present.  Cardiovascular: Normal rate, regular rhythm, normal heart sounds and intact distal pulses.  Exam reveals no gallop and no friction rub.   No murmur heard. Respiratory: Effort normal and breath sounds normal. No respiratory distress. She has no wheezes. She has no rales. She exhibits no tenderness.  GI: Soft. Bowel sounds are normal. She exhibits no distension and no mass. There is tenderness. There is no rebound and no guarding.  Genitourinary:       Vulva is normal without lesions Vagina is pink moist without discharge Cervix normal in appearance and pap is normal Uterus is normal size, contour, position, consistency, mobility, non-tender Adnexa is negative with normal sized ovaries by sonogram  Musculoskeletal: Normal range of motion. She exhibits no edema and no tenderness.   Neurological: She is alert and oriented to person, place, and time. She has normal reflexes. She displays normal reflexes. No cranial nerve deficit. She exhibits normal muscle tone. Coordination normal.  Skin: Skin is warm and dry. No rash noted. No erythema. No pallor.  Psychiatric: She has a normal mood and affect. Her behavior is normal. Judgment and thought content normal.    Labs: No results found for this or any previous visit (from the past 336 hour(s)).  EKG: No orders found for this or any previous visit.  Imaging Studies: ImagingResults  No results found.      Assessment: menometrorrhaiga     Patient Active Problem List   Diagnosis Date Noted  . Acute diverticulitis 12/02/2012  . Leukocytosis, unspecified 12/02/2012  . Hyponatremia 12/02/2012  . Hypokalemia 12/02/2012  . Hyperglycemia 12/02/2012  . Obesity, unspecified 12/02/2012  . Sepsis ([place]) 12/02/2012  . Diverticulitis of colon with perforation 12/02/2012  .  Hypothyroidism 12/02/2012  . Morbid obesity ([place]) 12/02/2012  . Tobacco abuse 12/02/2012  . Thyroid disease   . Anxiety     Plan: Hysteroscopy uterine curettage endometrial ablation 02/14/2016  Jabree Rebert H 02/09/2016 9:44 AM

## 2016-02-14 NOTE — Interval H&P Note (Signed)
History and Physical Interval Note:  02/14/2016 12:09 PM  [name]  has presented today for surgery, with the diagnosis of menorrhagia  The various methods of treatment have been discussed with the patient and family. After consideration of risks, benefits and other options for treatment, the patient has consented to  Procedure(s): [place] (N/A) as a surgical intervention .  The patient's history has been reviewed, patient examined, no change in status, stable for surgery.  I have reviewed the patient's chart and labs.  Questions were answered to the patient's satisfaction.     EURE,LUTHER H

## 2016-02-16 ENCOUNTER — Encounter (HOSPITAL_COMMUNITY): Payer: Self-pay | Admitting: Obstetrics & Gynecology

## 2016-03-01 ENCOUNTER — Ambulatory Visit (INDEPENDENT_AMBULATORY_CARE_PROVIDER_SITE_OTHER): Payer: BLUE CROSS/BLUE SHIELD | Admitting: Obstetrics & Gynecology

## 2016-03-01 ENCOUNTER — Encounter: Payer: Self-pay | Admitting: Obstetrics & Gynecology

## 2016-03-01 VITALS — BP 139/80 | HR 88 | Wt 265.0 lb

## 2016-03-01 DIAGNOSIS — N840 Polyp of corpus uteri: Secondary | ICD-10-CM

## 2016-03-01 DIAGNOSIS — N921 Excessive and frequent menstruation with irregular cycle: Secondary | ICD-10-CM

## 2016-03-01 DIAGNOSIS — Z9889 Other specified postprocedural states: Secondary | ICD-10-CM | POA: Diagnosis not present

## 2016-03-01 NOTE — Progress Notes (Signed)
  HPI: Patient returns for routine postoperative follow-up having undergone hysteroscopy uterine curettage endometrial ablation on 02/14/2016.  The patient's immediate postoperative recovery has been unremarkable. Since hospital discharge the patient reports no problems.   Current Outpatient Prescriptions: ALPRAZolam (XANAX) 1 MG tablet, Take 0.5-1 mg by mouth 3 (three) times daily as needed for anxiety. , Disp: , Rfl:  Aspirin-Acetaminophen-Caffeine (GOODY HEADACHE PO), Take 1 packet by mouth daily as needed (headaches)., Disp: , Rfl:  atorvastatin (LIPITOR) 20 MG tablet, Take 20 mg by mouth every evening. , Disp: , Rfl: 11 Carboxymethylcellul-Glycerin (LUBRICATING EYE DROPS OP), Apply 1 drop to eye daily as needed (dry eyes)., Disp: , Rfl:  Cholecalciferol (VITAMIN D3) 5000 units CAPS, Take 5,000 Units by mouth 2 (two) times daily., Disp: , Rfl:  cyclobenzaprine (FLEXERIL) 10 MG tablet, Take 10mg s daily at bedtime as needed for neck and shoulder tightness, Disp: , Rfl: 11 dicyclomine (BENTYL) 10 MG capsule, Take 1 capsule (10 mg total) by mouth 3 (three) times daily before meals., Disp: 90 capsule, Rfl: 1 HYDROcodone-acetaminophen (NORCO) 10-325 MG tablet, TAKE 1/2-1 TABLET UP TO 4 TIMES A DAY PRN FOR SEVERE PAIN., Disp: , Rfl: 0 levothyroxine (SYNTHROID, LEVOTHROID) 175 MCG tablet, Take 175 mcg by mouth daily before breakfast., Disp: , Rfl:  metFORMIN (GLUCOPHAGE) 500 MG tablet, Take 1,000 mg by mouth 2 (two) times daily with a meal., Disp: , Rfl:  Multiple Vitamins-Calcium (ONE-A-DAY WOMENS FORMULA) TABS, Take 1 tablet by mouth daily. , Disp: , Rfl:  Omega-3 Fatty Acids (FISH OIL) 1200 MG CAPS, Take 1,200 mg by mouth daily., Disp: , Rfl:  ondansetron (ZOFRAN) 8 MG tablet, Take 1 tablet (8 mg total) by mouth every 8 (eight) hours as needed for nausea., Disp: 12 tablet, Rfl: 0 ranitidine (ZANTAC) 150 MG tablet, Take 150 mg by mouth daily. , Disp: , Rfl:  ketorolac (TORADOL) 10 MG tablet, Take  1 tablet (10 mg total) by mouth every 8 (eight) hours as needed. (Patient not taking: Reported on 03/01/2016), Disp: 15 tablet, Rfl: 0  No current facility-administered medications for this visit.     Blood pressure 139/80, pulse 88, weight 265 lb (120.2 kg), last menstrual period 12/25/2015.  Physical Exam: Normal post ablation  Diagnostic Tests:   Pathology: Benign polyp  Impression: S/p endo ablation  Plan:   Follow up: 1  years  Florian Buff, MD

## 2016-03-13 ENCOUNTER — Encounter (HOSPITAL_COMMUNITY): Payer: Self-pay | Admitting: Emergency Medicine

## 2016-03-13 ENCOUNTER — Emergency Department (HOSPITAL_COMMUNITY)
Admission: EM | Admit: 2016-03-13 | Discharge: 2016-03-13 | Disposition: A | Discharge status: Home / Self Care | Payer: BLUE CROSS/BLUE SHIELD | Attending: Emergency Medicine | Admitting: Emergency Medicine

## 2016-03-13 ENCOUNTER — Emergency Department (HOSPITAL_COMMUNITY): Payer: BLUE CROSS/BLUE SHIELD

## 2016-03-13 DIAGNOSIS — E039 Hypothyroidism, unspecified: Secondary | ICD-10-CM | POA: Diagnosis not present

## 2016-03-13 DIAGNOSIS — E119 Type 2 diabetes mellitus without complications: Secondary | ICD-10-CM | POA: Diagnosis not present

## 2016-03-13 DIAGNOSIS — F17210 Nicotine dependence, cigarettes, uncomplicated: Secondary | ICD-10-CM | POA: Insufficient documentation

## 2016-03-13 DIAGNOSIS — Z7984 Long term (current) use of oral hypoglycemic drugs: Secondary | ICD-10-CM | POA: Diagnosis not present

## 2016-03-13 DIAGNOSIS — Z79899 Other long term (current) drug therapy: Secondary | ICD-10-CM | POA: Diagnosis not present

## 2016-03-13 DIAGNOSIS — R34 Anuria and oliguria: Secondary | ICD-10-CM | POA: Insufficient documentation

## 2016-03-13 DIAGNOSIS — Z7982 Long term (current) use of aspirin: Secondary | ICD-10-CM | POA: Diagnosis not present

## 2016-03-13 DIAGNOSIS — R319 Hematuria, unspecified: Secondary | ICD-10-CM | POA: Diagnosis not present

## 2016-03-13 DIAGNOSIS — R1032 Left lower quadrant pain: Secondary | ICD-10-CM | POA: Insufficient documentation

## 2016-03-13 DIAGNOSIS — R110 Nausea: Secondary | ICD-10-CM | POA: Insufficient documentation

## 2016-03-13 DIAGNOSIS — R109 Unspecified abdominal pain: Secondary | ICD-10-CM

## 2016-03-13 DIAGNOSIS — R300 Dysuria: Secondary | ICD-10-CM | POA: Diagnosis not present

## 2016-03-13 LAB — URINALYSIS, ROUTINE W REFLEX MICROSCOPIC
Bilirubin Urine: NEGATIVE
Glucose, UA: NEGATIVE mg/dL
KETONES UR: NEGATIVE mg/dL
NITRITE: NEGATIVE
PH: 7 (ref 5.0–8.0)
PROTEIN: NEGATIVE mg/dL
Specific Gravity, Urine: 1.004 — ABNORMAL LOW (ref 1.005–1.030)

## 2016-03-13 LAB — PREGNANCY, URINE: Preg Test, Ur: NEGATIVE

## 2016-03-13 MED ORDER — HYDROMORPHONE HCL 1 MG/ML IJ SOLN
1.0000 mg | Freq: Once | INTRAMUSCULAR | Status: AC
  Administered 2016-03-13: 1 mg via INTRAVENOUS
  Filled 2016-03-13: qty 1

## 2016-03-13 MED ORDER — OXYCODONE-ACETAMINOPHEN 5-325 MG PO TABS: 1.0000 | ORAL_TABLET | ORAL | 0 refills | Status: AC | PRN

## 2016-03-13 MED ORDER — ONDANSETRON HCL 4 MG/2ML IJ SOLN
4.0000 mg | Freq: Once | INTRAMUSCULAR | Status: AC
  Administered 2016-03-13: 4 mg via INTRAVENOUS
  Filled 2016-03-13: qty 2

## 2016-03-13 MED ORDER — ONDANSETRON HCL 4 MG PO TABS: 4.0000 mg | ORAL_TABLET | Freq: Four times a day (QID) | ORAL | 0 refills | Status: AC

## 2016-03-13 NOTE — ED Provider Notes (Signed)
[facility] DEPT Provider Note   CSN: [id number] Arrival date & time: 03/13/16  2009     History   Chief Complaint Chief Complaint  Patient presents with  . Abdominal Pain    HPI [name] is a 46 y.o. female.  HPI   [name] is a 46 y.o. female who presents to the Emergency Department complaining of increasing left flank pain that began one day prior to arrival.  She described a waxing and waning pain one day ago the became worse this afternoon.  She states the pain is now sharp and radiating to her left groin and associated with decreased urination and hesitancy.  She denies previous kidney stone but states several family members have a history.  Mild nausea this evening, no vomiting.  She has tried OTC analgesics without relief.  She denies vomiting, fever, injury, pelvic pain, vaginal discharge or bleeding.     Past Medical History:  Diagnosis Date  . Anxiety   . Diabetes mellitus without complication ([place])   . Diverticulitis   . GERD (gastroesophageal reflux disease)   . Hypercholesteremia   . Hypothyroidism   . Tobacco abuse 12/02/2012    Patient Active Problem List   Diagnosis Date Noted  . Acute diverticulitis 12/02/2012  . Leukocytosis, unspecified 12/02/2012  . Hyponatremia 12/02/2012  . Hypokalemia 12/02/2012  . Hyperglycemia 12/02/2012  . Obesity, unspecified 12/02/2012  . Sepsis ([place]) 12/02/2012  . Diverticulitis of colon with perforation 12/02/2012  . Hypothyroidism 12/02/2012  . Morbid obesity ([place]) 12/02/2012  . Tobacco abuse 12/02/2012  . Thyroid disease   . Anxiety     Past Surgical History:  Procedure Laterality Date  . CHOLECYSTECTOMY  2008   [place] N/A 02/05/2013   Procedure: COLONOSCOPY;  Surgeon: Rogene Houston, MD;  Location: [facility];  Service: Endoscopy;  Laterality: N/A;  320-moved to [place] notified pt  . DILITATION & CURRETTAGE/HYSTROSCOPY WITH NOVASURE ABLATION N/A 02/14/2016   Procedure: DILATATION & CURETTAGE/HYSTEROSCOPY WITH NOVASURE ABLATION;  Surgeon: Florian Buff, MD;  Location: [facility];  Service: Gynecology;  Laterality: N/A;  [place] Length - 5.5 Width - 4.2 Power - 127 Time - 1 minute 3 seconds    OB History    Gravida Para Term Preterm AB Living   0 0 0 0 0 0   SAB TAB Ectopic Multiple Live Births   0 0 0 0 0       Home Medications    Prior to Admission medications   Medication Sig Start Date End Date Taking? Authorizing Provider  ALPRAZolam Duanne Moron) 1 MG tablet Take 0.5-1 mg by mouth 3 (three) times daily as needed for anxiety.    Yes Historical Provider, MD  atorvastatin (LIPITOR) 20 MG tablet Take 20 mg by mouth every evening.  12/15/15  Yes Historical Provider, MD  Cholecalciferol (VITAMIN D3) 5000 units CAPS Take 5,000 Units by mouth 2 (two) times daily.   Yes Historical Provider, MD  dicyclomine (BENTYL) 10 MG capsule Take 1 capsule (10 mg total) by mouth 3 (three) times daily before meals. 01/05/13  Yes Rogene Houston, MD  levothyroxine (SYNTHROID, LEVOTHROID) 175 MCG tablet Take 175 mcg by mouth daily before breakfast.   Yes Historical Provider, MD  metFORMIN (GLUCOPHAGE) 500 MG tablet Take 1,000 mg by mouth 2 (two) times daily with a meal.   Yes Historical Provider, MD  Multiple Vitamins-Calcium (ONE-A-DAY WOMENS FORMULA) TABS Take 1 tablet by mouth daily.  Yes Historical Provider, MD  Omega-3 Fatty Acids (FISH OIL) 1200 MG CAPS Take 1,200 mg by mouth daily.   Yes Historical Provider, MD  ranitidine (ZANTAC) 150 MG tablet Take 150 mg by mouth daily.    Yes Historical Provider, MD  Aspirin-Acetaminophen-Caffeine (GOODY HEADACHE PO) Take 1 packet by mouth daily as needed (headaches).    Historical Provider, MD  Carboxymethylcellul-Glycerin (LUBRICATING EYE DROPS OP) Apply 1 drop to eye daily as needed (dry eyes).    Historical Provider, MD  cyclobenzaprine (FLEXERIL) 10 MG tablet Take 10mg s daily at bedtime as needed for neck and shoulder  tightness 10/09/15   Historical Provider, MD  ketorolac (TORADOL) 10 MG tablet Take 1 tablet (10 mg total) by mouth every 8 (eight) hours as needed. Patient not taking: Reported on 03/01/2016 02/14/16   Florian Buff, MD  ondansetron (ZOFRAN) 8 MG tablet Take 1 tablet (8 mg total) by mouth every 8 (eight) hours as needed for nausea. 02/14/16   Florian Buff, MD    Family History Family History  Problem Relation Age of Onset  . Hypertension Mother   . Prostate cancer Father   . Diverticulitis Father   . Prostate cancer Brother   . Alzheimer's disease Paternal Grandfather   . Other Paternal Grandmother     brain tumor  . Alzheimer's disease Maternal Grandmother   . Heart attack Maternal Grandfather     Social History Social History  Substance Use Topics  . Smoking status: Current Every Day Smoker    Packs/day: 0.50    Years: 31.00    Types: Cigarettes  . Smokeless tobacco: Never Used  . Alcohol use No     Allergies   Patient has no known allergies.   Review of Systems Review of Systems  Constitutional: Negative for appetite change, chills and fever.  HENT: Negative for sore throat.   Respiratory: Negative for cough, chest tightness and shortness of breath.   Gastrointestinal: Positive for nausea. Negative for abdominal pain and vomiting.  Genitourinary: Positive for decreased urine volume, difficulty urinating, dysuria and flank pain. Negative for hematuria, vaginal bleeding and vaginal discharge.  Neurological: Negative for dizziness and headaches.     Physical Exam Updated Vital Signs BP 166/88   Pulse 87   Temp 98 F (36.7 C)   Resp 20   Ht 5\' 7"  (1.702 m)   Wt 120.2 kg   SpO2 100%   BMI 41.50 kg/m   Physical Exam  Constitutional: She is oriented to person, place, and time. She appears well-developed and well-nourished.  Uncomfortably appearing  HENT:  Mouth/Throat: Oropharynx is clear and moist.  Neck: Normal range of motion. No tracheal deviation  present.  Cardiovascular: Normal rate and regular rhythm.   No murmur heard. Pulmonary/Chest: Effort normal and breath sounds normal. No respiratory distress.  Abdominal: Soft. She exhibits no distension and no mass. There is no tenderness. There is no guarding.  Pt points to area of left flank as source of pain, no CVA tenderness on exam.    Musculoskeletal: Normal range of motion.  Neurological: She is alert and oriented to person, place, and time.  Skin: Skin is warm. No rash noted.  Nursing note and vitals reviewed.    ED Treatments / Results  Labs (all labs ordered are listed, but only abnormal results are displayed) Labs Reviewed  URINALYSIS, ROUTINE W REFLEX MICROSCOPIC - Abnormal; Notable for the following:       Result Value   Color, Urine AMBER (*)  Specific Gravity, Urine 1.004 (*)    Hgb urine dipstick MODERATE (*)    Leukocytes, UA SMALL (*)    Bacteria, UA RARE (*)    All other components within normal limits  PREGNANCY, URINE    EKG  EKG Interpretation None       Radiology Ct Renal Stone Study  Result Date: 03/13/2016 CLINICAL DATA:  Initial evaluation for acute lower abdominal pain and left lower back pain. EXAM: CT ABDOMEN AND PELVIS WITHOUT CONTRAST TECHNIQUE: Multidetector CT imaging of the abdomen and pelvis was performed following the standard protocol without IV contrast. COMPARISON:  None. FINDINGS: Lower chest: Visualized lung bases are clear. Hepatobiliary: Limited noncontrast evaluation of the liver is unremarkable. Gallbladder surgically absent. No biliary dilatation. Pancreas: Pancreas within normal limits. Spleen: Spleen within normal limits. Adrenals/Urinary Tract: The adrenal glands are normal. Few punctate nonobstructive stones present within the upper pole of the right kidney. Probable additional punctate nonobstructive left renal calculus present within the upper pole as well. The no hydronephrosis or hydroureter. No radiopaque stones seen  along the course of either ureter. Bladder within normal limits. No layering stones within the bladder lumen. Stomach/Bowel: Stomach within normal limits. No evidence for bowel obstruction. Appendix normal. Colonic diverticulosis without evidence for acute diverticulitis. No acute inflammatory changes about the bowels. Vascular/Lymphatic: Intra- abdominal aorta of normal caliber. Mild atheromatous plaque within the infrarenal aorta. No adenopathy. Reproductive: Uterus and ovaries within normal limits. Other: No free air or fluid. Musculoskeletal: No acute osseous abnormality. No worrisome lytic or blastic osseous lesions. IMPRESSION: 1. Punctate nonobstructive bilateral nephrolithiasis. No CT evidence for obstructive uropathy. 2. No other acute intra-abdominal or pelvic process. 3. Colonic diverticulosis without evidence for acute diverticulitis. 4. Status post cholecystectomy. Electronically Signed   By: Jeannine Boga M.D.   On: 03/13/2016 22:02    Procedures Procedures (including critical care time)  Medications Ordered in ED Medications  HYDROmorphone (DILAUDID) injection 1 mg (1 mg Intravenous Given 03/13/16 2055)  ondansetron (ZOFRAN) injection 4 mg (4 mg Intravenous Given 03/13/16 2055)  HYDROmorphone (DILAUDID) injection 1 mg (1 mg Intravenous Given 03/13/16 2128)     Initial Impression / Assessment and Plan / ED Course  I have reviewed the triage vital signs and the nursing notes.  Pertinent labs & imaging results that were available during my care of the patient were reviewed by me and considered in my medical decision making (see chart for details).     2240  Pt resting comfortably.  Pain improved after IV medication.  Left flank pain and hematuria w/o CT evidence of ureteral stone.  Discussed findings with patient.  She agrees to f/u with urology if not improving.  ER return precautions given.  Final Clinical Impressions(s) / ED Diagnoses   Final diagnoses:  Left flank pain    Hematuria, unspecified type    New Prescriptions New Prescriptions   ONDANSETRON (ZOFRAN) 4 MG TABLET    Take 1 tablet (4 mg total) by mouth every 6 (six) hours.   OXYCODONE-ACETAMINOPHEN (PERCOCET/ROXICET) 5-325 MG TABLET    Take 1 tablet by mouth every 4 (four) hours as needed.     Kem Parkinson, PA-C 03/13/16 2254    Merrily Pew, MD 03/13/16 2337

## 2016-03-13 NOTE — Discharge Instructions (Signed)
Follow-up with your primary doctor or with the urology group listed if the pain is not improving.  Return to Er for any worsening symptoms

## 2016-03-13 NOTE — ED Triage Notes (Signed)
Pt c/o lower abd pain and left lower back pain. Pt states she when she urinates it is very little that comes out.

## 2016-03-20 ENCOUNTER — Emergency Department (HOSPITAL_COMMUNITY)
Admission: EM | Admit: 2016-03-20 | Discharge: 2016-03-20 | Disposition: A | Discharge status: Home / Self Care | Payer: BLUE CROSS/BLUE SHIELD | Attending: Emergency Medicine | Admitting: Emergency Medicine

## 2016-03-20 ENCOUNTER — Encounter (HOSPITAL_COMMUNITY): Payer: Self-pay | Admitting: Emergency Medicine

## 2016-03-20 DIAGNOSIS — Z79899 Other long term (current) drug therapy: Secondary | ICD-10-CM | POA: Insufficient documentation

## 2016-03-20 DIAGNOSIS — F17210 Nicotine dependence, cigarettes, uncomplicated: Secondary | ICD-10-CM | POA: Insufficient documentation

## 2016-03-20 DIAGNOSIS — Z7982 Long term (current) use of aspirin: Secondary | ICD-10-CM | POA: Diagnosis not present

## 2016-03-20 DIAGNOSIS — R109 Unspecified abdominal pain: Secondary | ICD-10-CM

## 2016-03-20 DIAGNOSIS — E039 Hypothyroidism, unspecified: Secondary | ICD-10-CM | POA: Insufficient documentation

## 2016-03-20 DIAGNOSIS — Z7984 Long term (current) use of oral hypoglycemic drugs: Secondary | ICD-10-CM | POA: Insufficient documentation

## 2016-03-20 DIAGNOSIS — E119 Type 2 diabetes mellitus without complications: Secondary | ICD-10-CM | POA: Insufficient documentation

## 2016-03-20 DIAGNOSIS — R1032 Left lower quadrant pain: Secondary | ICD-10-CM | POA: Diagnosis not present

## 2016-03-20 LAB — URINALYSIS, ROUTINE W REFLEX MICROSCOPIC
Bilirubin Urine: NEGATIVE
Glucose, UA: NEGATIVE mg/dL
KETONES UR: 5 mg/dL — AB
NITRITE: NEGATIVE
PROTEIN: NEGATIVE mg/dL
Specific Gravity, Urine: 1.02 (ref 1.005–1.030)
WBC, UA: NONE SEEN WBC/hpf (ref 0–5)
pH: 6 (ref 5.0–8.0)

## 2016-03-20 LAB — PREGNANCY, URINE: Preg Test, Ur: NEGATIVE

## 2016-03-20 MED ORDER — FENTANYL CITRATE (PF) 100 MCG/2ML IJ SOLN
50.0000 ug | Freq: Once | INTRAMUSCULAR | Status: AC
  Administered 2016-03-20: 50 ug via INTRAVENOUS
  Filled 2016-03-20: qty 2

## 2016-03-20 MED ORDER — DIAZEPAM 5 MG PO TABS
10.0000 mg | ORAL_TABLET | Freq: Once | ORAL | Status: AC
  Administered 2016-03-20: 10 mg via ORAL
  Filled 2016-03-20: qty 2

## 2016-03-20 MED ORDER — METHOCARBAMOL 500 MG PO TABS
1000.0000 mg | ORAL_TABLET | Freq: Once | ORAL | Status: AC
  Administered 2016-03-20: 1000 mg via ORAL
  Filled 2016-03-20: qty 2

## 2016-03-20 MED ORDER — NAPROXEN 500 MG PO TABS: ORAL_TABLET | ORAL | 0 refills | Status: AC

## 2016-03-20 MED ORDER — CYCLOBENZAPRINE HCL 10 MG PO TABS: 10.0000 mg | ORAL_TABLET | Freq: Three times a day (TID) | ORAL | 0 refills | Status: AC | PRN

## 2016-03-20 MED ORDER — KETOROLAC TROMETHAMINE 60 MG/2ML IM SOLN
60.0000 mg | Freq: Once | INTRAMUSCULAR | Status: AC
  Administered 2016-03-20: 60 mg via INTRAMUSCULAR
  Filled 2016-03-20: qty 2

## 2016-03-20 MED ORDER — FENTANYL CITRATE (PF) 100 MCG/2ML IJ SOLN
50.0000 ug | INTRAMUSCULAR | Status: DC | PRN
  Administered 2016-03-20: 50 ug via INTRAVENOUS
  Filled 2016-03-20: qty 2

## 2016-03-20 NOTE — ED Triage Notes (Signed)
Patient is having left side pain radiating from abdomen to left flank, starting around 1am at work.

## 2016-03-20 NOTE — ED Notes (Signed)
Prior to discharge pt was sitting up on bed, speaking with family member at bedside, no distress noted,

## 2016-03-20 NOTE — ED Provider Notes (Signed)
[facility] DEPT Provider Note   CSN: [id number] Arrival date & time: 03/20/16  0153  Time Seem 03:25 AM   History   Chief Complaint Chief Complaint  Patient presents with  . Flank Pain    HPI [name] is a 46 y.o. female.  HPI   patient reports she was at work at 12:45 AM and had acute onset of suprapubic pain that is now radiating to her left flank. She states she had something similar last week and came to the ED and had a CT scan which showed punctate renal stones but she did not have a ureteral stone. She states walking and putting pressure on her left leg makes the pain much worse, deep breathing makes it worse, and any movement makes the pain worse. Nothing she does makes it feel better. She denies nausea, vomiting, hematuria, or change in her chronic smoker's cough. She states she has had frequency and did have fever to 99.9 recently. She denies any known injury. She was seen yesterday in follow-up by her PCP and he prescribed a sulfa antibiotic which she has not filled yet. Other than last week, she has never had this pain before.    PCP  Robert Bellow, MD   Past Medical History:  Diagnosis Date  . Anxiety   . Diabetes mellitus without complication ([place])   . Diverticulitis   . GERD (gastroesophageal reflux disease)   . Hypercholesteremia   . Hypothyroidism   . Tobacco abuse 12/02/2012    Patient Active Problem List   Diagnosis Date Noted  . Acute diverticulitis 12/02/2012  . Leukocytosis, unspecified 12/02/2012  . Hyponatremia 12/02/2012  . Hypokalemia 12/02/2012  . Hyperglycemia 12/02/2012  . Obesity, unspecified 12/02/2012  . Sepsis ([place]) 12/02/2012  . Diverticulitis of colon with perforation 12/02/2012  . Hypothyroidism 12/02/2012  . Morbid obesity ([place]) 12/02/2012  . Tobacco abuse 12/02/2012  . Thyroid disease   . Anxiety     Past Surgical History:  Procedure Laterality Date  . CHOLECYSTECTOMY  2008   [place]  N/A 02/05/2013   Procedure: COLONOSCOPY;  Surgeon: Rogene Houston, MD;  Location: [facility] ENDO SUITE;  Service: Endoscopy;  Laterality: N/A;  320-moved to [place] notified pt  . DILITATION & CURRETTAGE/HYSTROSCOPY WITH NOVASURE ABLATION N/A 02/14/2016   Procedure: DILATATION & CURETTAGE/HYSTEROSCOPY WITH NOVASURE ABLATION;  Surgeon: Florian Buff, MD;  Location: [facility] ORS;  Service: Gynecology;  Laterality: N/A;  Novasure Length - 5.5 Width - 4.2 Power - 127 Time - 1 minute 3 seconds    OB History    Gravida Para Term Preterm AB Living   0 0 0 0 0 0   SAB TAB Ectopic Multiple Live Births   0 0 0 0 0       Home Medications    Prior to Admission medications   Medication Sig Start Date End Date Taking? Authorizing Provider  Aspirin-Acetaminophen-Caffeine (GOODY HEADACHE PO) Take 1 packet by mouth daily as needed (headaches).   Yes Historical Provider, MD  atorvastatin (LIPITOR) 20 MG tablet Take 20 mg by mouth every evening.  12/15/15  Yes Historical Provider, MD  Carboxymethylcellul-Glycerin (LUBRICATING EYE DROPS OP) Apply 1 drop to eye daily as needed (dry eyes).   Yes Historical Provider, MD  Cholecalciferol (VITAMIN D3) 5000 units CAPS Take 5,000 Units by mouth 2 (two) times daily.   Yes Historical Provider, MD  levothyroxine (SYNTHROID, LEVOTHROID) 175 MCG tablet Take 175 mcg by mouth daily before breakfast.  Yes Historical Provider, MD  metFORMIN (GLUCOPHAGE) 500 MG tablet Take 1,000 mg by mouth 2 (two) times daily with a meal.   Yes Historical Provider, MD  Multiple Vitamins-Calcium (ONE-A-DAY WOMENS FORMULA) TABS Take 1 tablet by mouth daily.    Yes Historical Provider, MD  Omega-3 Fatty Acids (FISH OIL) 1200 MG CAPS Take 1,200 mg by mouth daily.   Yes Historical Provider, MD  ondansetron (ZOFRAN) 4 MG tablet Take 1 tablet (4 mg total) by mouth every 6 (six) hours. 03/13/16  Yes Tammy Triplett, PA-C  oxyCODONE-acetaminophen (PERCOCET/ROXICET) 5-325 MG tablet Take 1 tablet by mouth  every 4 (four) hours as needed. 03/13/16  Yes Tammy Triplett, PA-C  ranitidine (ZANTAC) 150 MG tablet Take 150 mg by mouth daily.    Yes Historical Provider, MD  ALPRAZolam Duanne Moron) 1 MG tablet Take 0.5-1 mg by mouth 3 (three) times daily as needed for anxiety.     Historical Provider, MD  cyclobenzaprine (FLEXERIL) 10 MG tablet Take 1 tablet (10 mg total) by mouth 3 (three) times daily as needed (muscle soreness). 03/20/16   Rolland Porter, MD  dicyclomine (BENTYL) 10 MG capsule Take 1 capsule (10 mg total) by mouth 3 (three) times daily before meals. 01/05/13   Rogene Houston, MD  ketorolac (TORADOL) 10 MG tablet Take 1 tablet (10 mg total) by mouth every 8 (eight) hours as needed. 02/14/16   Florian Buff, MD  naproxen (NAPROSYN) 500 MG tablet Take 1 po BID with food prn pain 03/20/16   Rolland Porter, MD    Family History Family History  Problem Relation Age of Onset  . Hypertension Mother   . Prostate cancer Father   . Diverticulitis Father   . Prostate cancer Brother   . Alzheimer's disease Paternal Grandfather   . Other Paternal Grandmother     brain tumor  . Alzheimer's disease Maternal Grandmother   . Heart attack Maternal Grandfather     Social History Social History  Substance Use Topics  . Smoking status: Current Every Day Smoker    Packs/day: 0.50    Years: 31.00    Types: Cigarettes  . Smokeless tobacco: Never Used  . Alcohol use No  employed   Allergies   Patient has no known allergies.   Review of Systems Review of Systems  All other systems reviewed and are negative.    Physical Exam Updated Vital Signs BP 162/62 (BP Location: Left Arm)   Pulse 78   Temp 98.6 F (37 C) (Oral)   Resp 22   Ht 5\' 7"  (1.702 m)   Wt 265 lb (120.2 kg)   SpO2 98%   BMI 41.50 kg/m   Vital signs normal except hypertension   Physical Exam  Constitutional: She is oriented to person, place, and time. She appears well-developed and well-nourished.  Non-toxic appearance. She does  not appear ill. She appears distressed.  HENT:  Head: Normocephalic and atraumatic.  Right Ear: External ear normal.  Left Ear: External ear normal.  Nose: Nose normal. No mucosal edema or rhinorrhea.  Mouth/Throat: Oropharynx is clear and moist and mucous membranes are normal. No dental abscesses or uvula swelling.  Eyes: Conjunctivae and EOM are normal. Pupils are equal, round, and reactive to light.  Neck: Normal range of motion and full passive range of motion without pain. Neck supple.  Cardiovascular: Normal rate, regular rhythm and normal heart sounds.  Exam reveals no gallop and no friction rub.   No murmur heard. Pulmonary/Chest: Effort normal and  breath sounds normal. No respiratory distress. She has no wheezes. She has no rhonchi. She has no rales. She exhibits no tenderness and no crepitus.  Abdominal: Soft. Normal appearance and bowel sounds are normal. She exhibits no distension. There is no tenderness. There is no rebound and no guarding.    Musculoskeletal: Normal range of motion. She exhibits no edema or tenderness.       Back:  nontender thoracic or lumbar spine. Tender in the left flank area. Has pain on ROM at the waist to the left and forward flexion, not to the right.   Neurological: She is alert and oriented to person, place, and time. She has normal strength. No cranial nerve deficit.  Skin: Skin is warm, dry and intact. No rash noted. No erythema. No pallor.  Psychiatric: She has a normal mood and affect. Her speech is normal and behavior is normal. Her mood appears not anxious.  Nursing note and vitals reviewed.    ED Treatments / Results  Labs (all labs ordered are listed, but only abnormal results are displayed) Results for orders placed or performed during the hospital encounter of 03/20/16  Urinalysis, Routine w reflex microscopic  Result Value Ref Range   Color, Urine YELLOW YELLOW   APPearance CLEAR CLEAR   Specific Gravity, Urine 1.020 1.005 - 1.030     pH 6.0 5.0 - 8.0   Glucose, UA NEGATIVE NEGATIVE mg/dL   Hgb urine dipstick MODERATE (A) NEGATIVE   Bilirubin Urine NEGATIVE NEGATIVE   Ketones, ur 5 (A) NEGATIVE mg/dL   Protein, ur NEGATIVE NEGATIVE mg/dL   Nitrite NEGATIVE NEGATIVE   Leukocytes, UA TRACE (A) NEGATIVE   RBC / HPF TOO NUMEROUS TO COUNT 0 - 5 RBC/hpf   WBC, UA NONE SEEN 0 - 5 WBC/hpf   Bacteria, UA FEW (A) NONE SEEN   Mucous PRESENT   Pregnancy, urine  Result Value Ref Range   Preg Test, Ur NEGATIVE NEGATIVE   Laboratory interpretation all normal except hematuria  \  EKG  EKG Interpretation None       Radiology No results found.   Ct Renal Stone Study  Result Date: 03/13/2016 CLINICAL DATA:  Initial evaluation for acute lower abdominal pain and left lower back pain. Marland Kitchen IMPRESSION: 1. Punctate nonobstructive bilateral nephrolithiasis. No CT evidence for obstructive uropathy. 2. No other acute intra-abdominal or pelvic process. 3. Colonic diverticulosis without evidence for acute diverticulitis. 4. Status post cholecystectomy. Electronically Signed   By: Jeannine Boga M.D.   On: 03/13/2016 22:02   Procedures Procedures (including critical care time)  Medications Ordered in ED Medications  ketorolac (TORADOL) injection 60 mg (60 mg Intramuscular Given 03/20/16 0301)  diazepam (VALIUM) tablet 10 mg (10 mg Oral Given 03/20/16 0345)  methocarbamol (ROBAXIN) tablet 1,000 mg (1,000 mg Oral Given 03/20/16 0422)  fentaNYL (SUBLIMAZE) injection 50 mcg (50 mcg Intravenous Given 03/20/16 0535)  fentanyl 50 MCG IM   Initial Impression / Assessment and Plan / ED Course  I have reviewed the triage vital signs and the nursing notes.  Pertinent labs & imaging results that were available during my care of the patient were reviewed by me and considered in my medical decision making (see chart for details).    Patient did have punctate renal stones on CT scan however she did not have any ureteral stones. Her pain  is very atypical for kidney stones and that is tender to palpation, it hurts when she moves or changes positions or when she bears  weight on her left leg. She was given Toradol IM, fentanyl IM, Valium orally and then for continued pain she was given oral Robaxin.  Recheck at 05:30 AM still having pain, given IV fentanyl.She states his pain seems different from last week where she had to pace around because of the pain. However she did not have her ureteral stone last week. Her pain now is very atypical.  Review of the [place] shows patient gets #90 alprazolam 1 mg tablets monthly from her PCP. She also received #120 hydrocodone 10/325 on October 27. She states she has migraine headaches and she takes about 2 a week for that. She also received #12 oxycodone on January 25 from the ED.  Final Clinical Impressions(s) / ED Diagnoses   Final diagnoses:  Left flank pain  Abdominal pain, acute, left lower quadrant    New Prescriptions New Prescriptions   CYCLOBENZAPRINE (FLEXERIL) 10 MG TABLET    Take 1 tablet (10 mg total) by mouth 3 (three) times daily as needed (muscle soreness).   NAPROXEN (NAPROSYN) 500 MG TABLET    Take 1 po BID with food prn pain    Plan discharge  Rolland Porter, MD, Barbette Or, MD 03/20/16 [phone]

## 2016-03-20 NOTE — Discharge Instructions (Signed)
Drink plenty of fluids. Take the medications as prescribed. Use ice and heat for comfort. Start your antibiotic. Recheck if you get a fever, vomiting or seem worse.

## 2017-02-14 ENCOUNTER — Other Ambulatory Visit (HOSPITAL_COMMUNITY): Payer: Self-pay | Admitting: Family Medicine

## 2017-02-14 ENCOUNTER — Ambulatory Visit (HOSPITAL_COMMUNITY)
Admission: RE | Admit: 2017-02-14 | Discharge: 2017-02-14 | Disposition: A | Discharge status: Home / Self Care | Payer: BLUE CROSS/BLUE SHIELD | Source: Ambulatory Visit | Attending: Family Medicine | Admitting: Family Medicine

## 2017-02-14 ENCOUNTER — Other Ambulatory Visit (HOSPITAL_COMMUNITY)
Admission: RE | Admit: 2017-02-14 | Discharge: 2017-02-14 | Disposition: A | Discharge status: Home / Self Care | Payer: BLUE CROSS/BLUE SHIELD | Source: Ambulatory Visit | Attending: Family Medicine | Admitting: Family Medicine

## 2017-02-14 DIAGNOSIS — J449 Chronic obstructive pulmonary disease, unspecified: Secondary | ICD-10-CM

## 2017-02-14 DIAGNOSIS — R69 Illness, unspecified: Secondary | ICD-10-CM | POA: Insufficient documentation

## 2017-02-14 LAB — COMPREHENSIVE METABOLIC PANEL
ALBUMIN: 3.9 g/dL (ref 3.5–5.0)
ALK PHOS: 87 U/L (ref 38–126)
ALT: 17 U/L (ref 14–54)
AST: 20 U/L (ref 15–41)
Anion gap: 11 (ref 5–15)
BUN: 14 mg/dL (ref 6–20)
CHLORIDE: 103 mmol/L (ref 101–111)
CO2: 22 mmol/L (ref 22–32)
CREATININE: 0.64 mg/dL (ref 0.44–1.00)
Calcium: 9.3 mg/dL (ref 8.9–10.3)
GFR calc Af Amer: >60 mL/min (ref >60)
GFR calc non Af Amer: >60 mL/min (ref >60)
GLUCOSE: 180 mg/dL — AB (ref 65–99)
Potassium: 4 mmol/L (ref 3.5–5.1)
SODIUM: 136 mmol/L (ref 135–145)
Total Bilirubin: 0.8 mg/dL (ref 0.3–1.2)
Total Protein: 7.7 g/dL (ref 6.5–8.1)

## 2017-02-14 LAB — HEMOGLOBIN A1C
HEMOGLOBIN A1C: 7.6 % — AB (ref 4.8–5.6)
MEAN PLASMA GLUCOSE: 171.42 mg/dL

## 2017-02-14 LAB — LIPID PANEL
CHOL/HDL RATIO: 4.6 ratio
Cholesterol: 162 mg/dL (ref 0–200)
HDL: 35 mg/dL — ABNORMAL LOW (ref >40)
LDL Cholesterol: 101 mg/dL — ABNORMAL HIGH (ref 0–99)
Triglycerides: 130 mg/dL (ref <150)
VLDL: 26 mg/dL (ref 0–40)

## 2017-02-14 LAB — CBC
HCT: 46.3 % — ABNORMAL HIGH (ref 36.0–46.0)
Hemoglobin: 14.9 g/dL (ref 12.0–15.0)
MCH: 30.3 pg (ref 26.0–34.0)
MCHC: 32.2 g/dL (ref 30.0–36.0)
MCV: 94.3 fL (ref 78.0–100.0)
PLATELETS: 218 10*3/uL (ref 150–400)
RBC: 4.91 MIL/uL (ref 3.87–5.11)
RDW: 13.7 % (ref 11.5–15.5)
WBC: 12.7 10*3/uL — ABNORMAL HIGH (ref 4.0–10.5)

## 2019-10-30 IMAGING — DX DG CHEST 2V
2 series · 2 of 2 positions shown · non-contrast
Comparison: 02/26/2011.

CLINICAL DATA: COPD.

EXAM:
CHEST  2 VIEW

[chest lat]
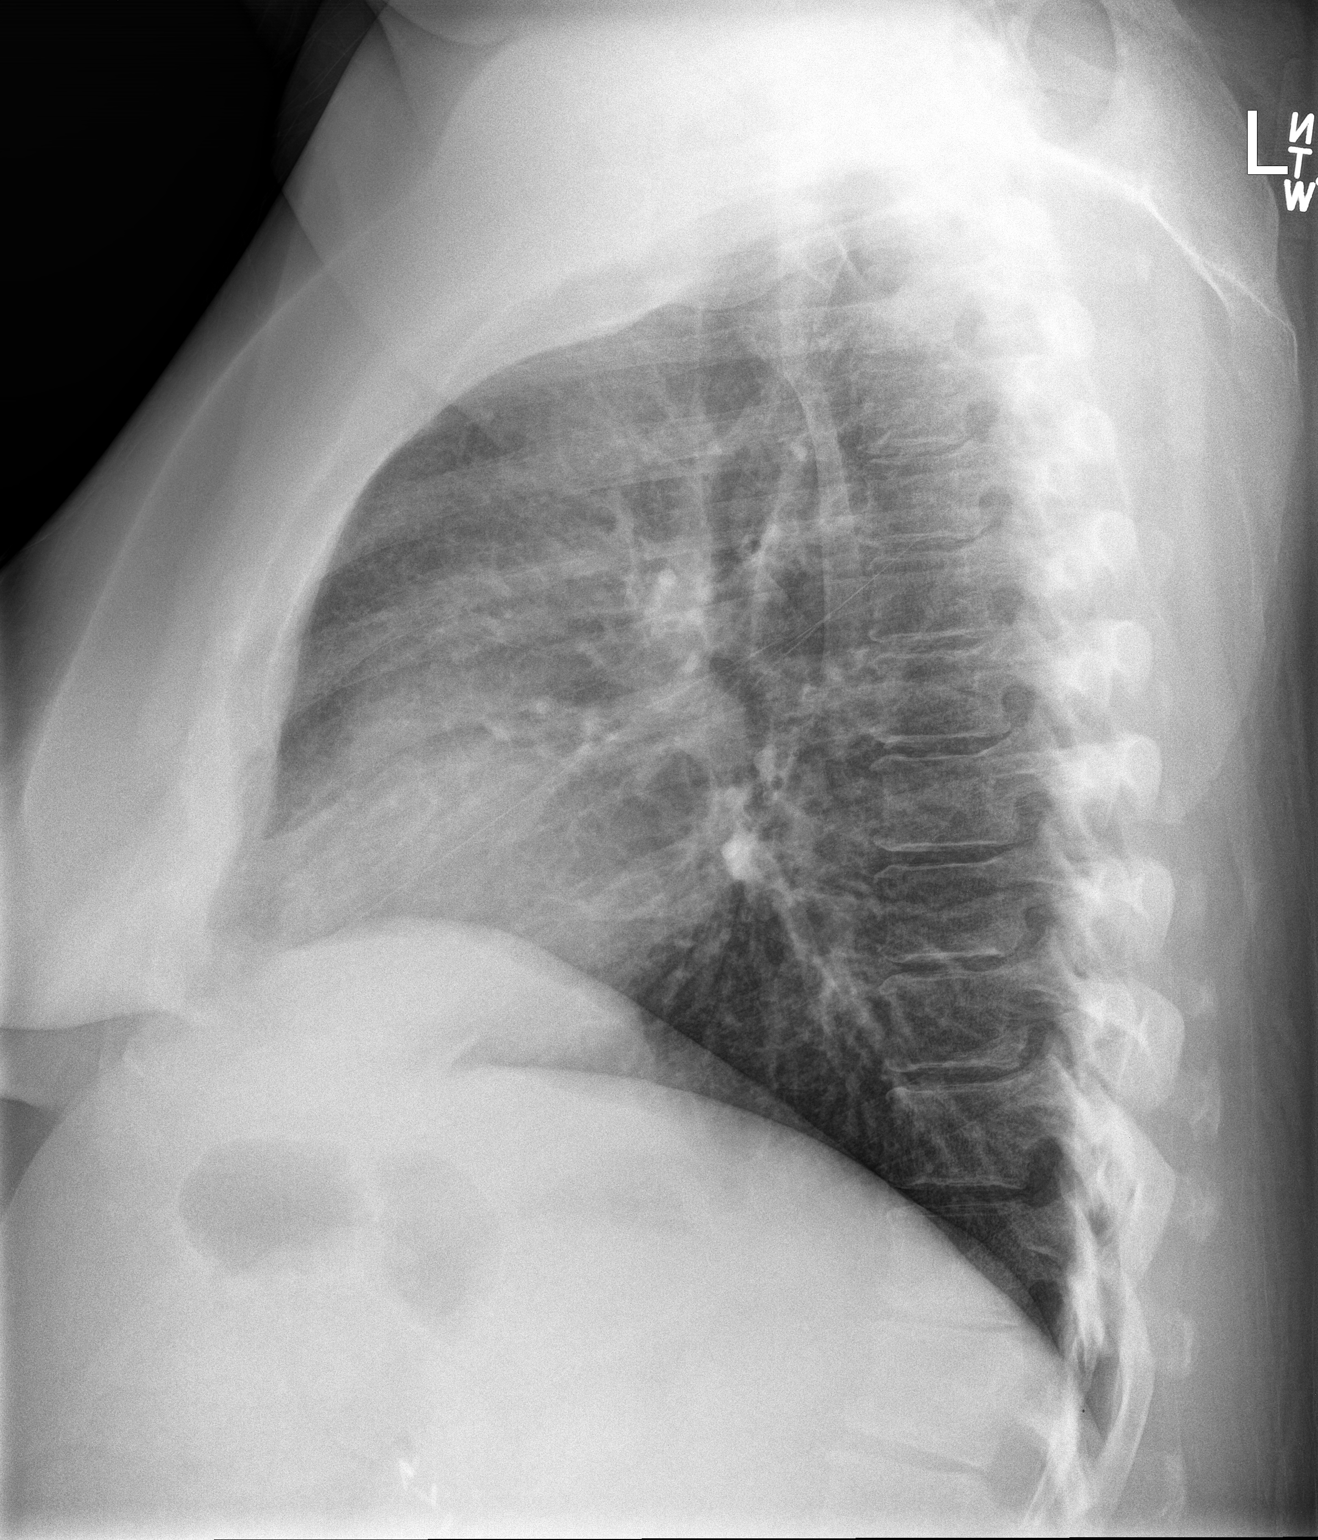

[chest pa]
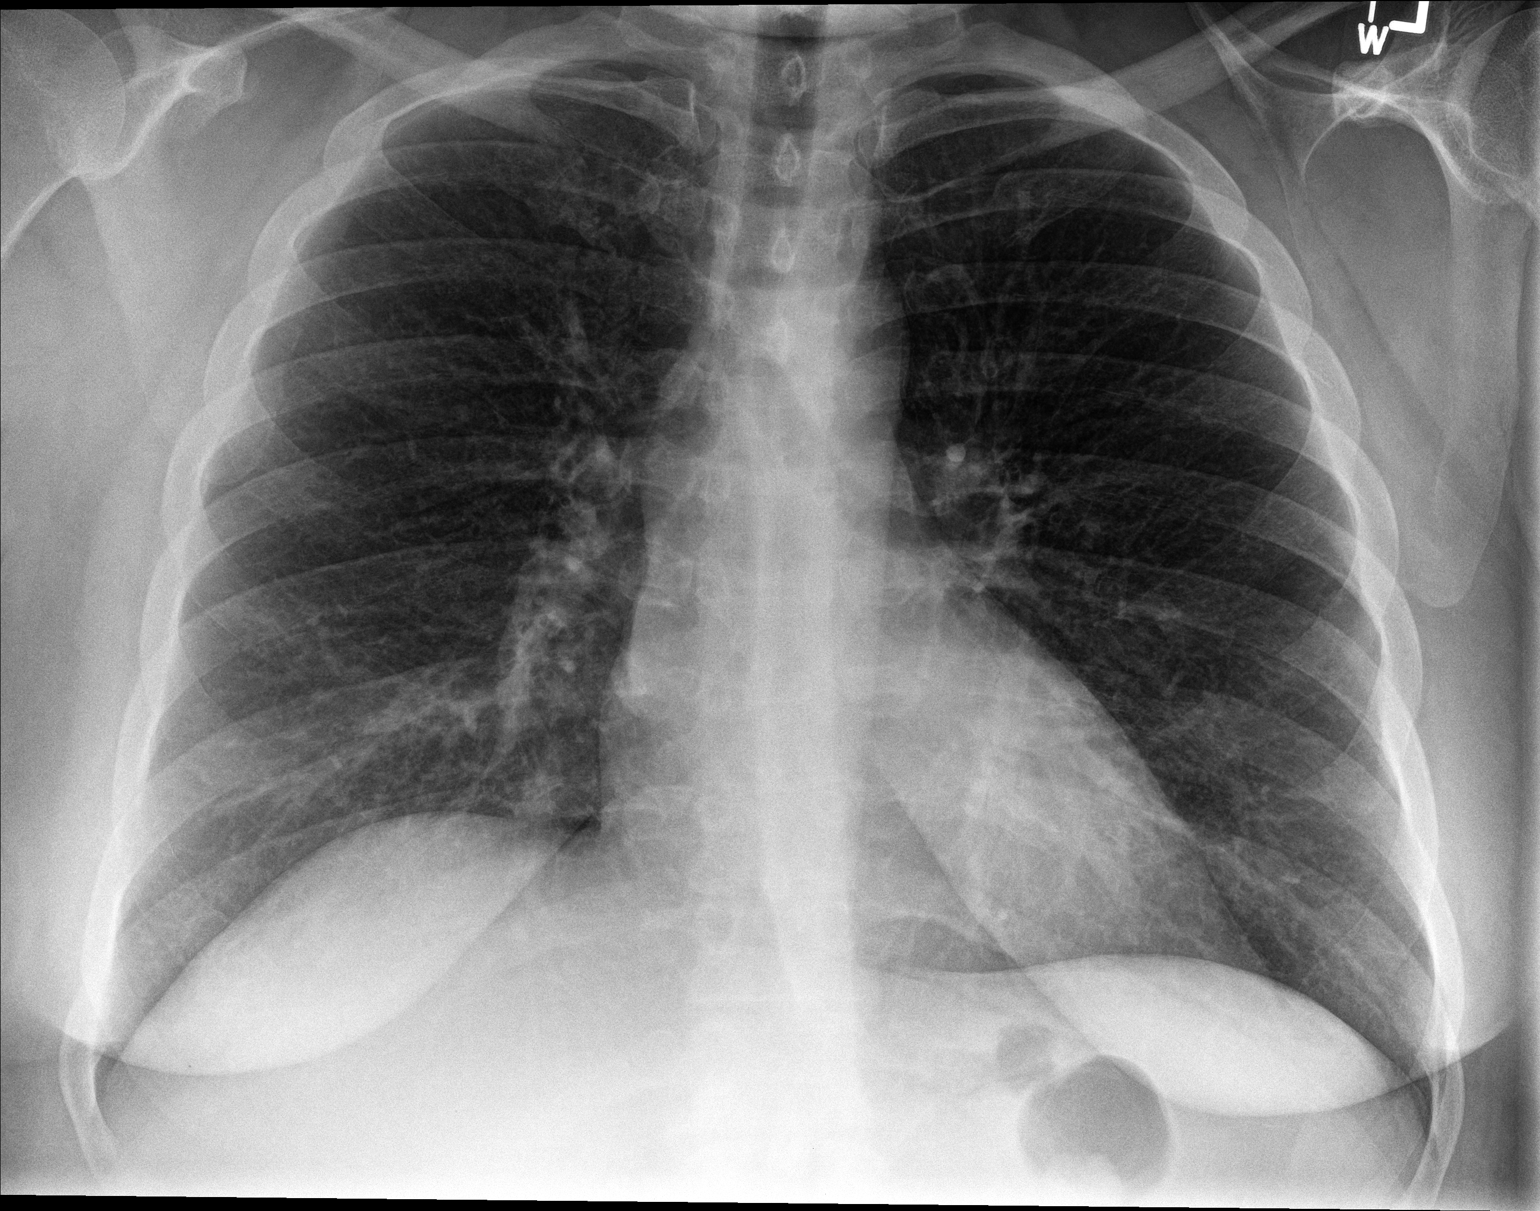

[2 of 2 positions shown; findings below may reference images not displayed]

FINDINGS: Trachea is midline. Heart size normal. Lungs are clear. No pleural
fluid.
IMPRESSION: Negative.

## 2021-05-23 DIAGNOSIS — R21 Rash and other nonspecific skin eruption: Secondary | ICD-10-CM | POA: Diagnosis not present

## 2021-05-23 DIAGNOSIS — E039 Hypothyroidism, unspecified: Secondary | ICD-10-CM | POA: Diagnosis not present

## 2021-05-23 DIAGNOSIS — E1165 Type 2 diabetes mellitus with hyperglycemia: Secondary | ICD-10-CM | POA: Diagnosis not present

## 2021-05-23 DIAGNOSIS — I10 Essential (primary) hypertension: Secondary | ICD-10-CM | POA: Diagnosis not present

## 2021-08-29 DIAGNOSIS — I10 Essential (primary) hypertension: Secondary | ICD-10-CM | POA: Diagnosis not present

## 2021-08-29 DIAGNOSIS — E1165 Type 2 diabetes mellitus with hyperglycemia: Secondary | ICD-10-CM | POA: Diagnosis not present

## 2021-08-29 DIAGNOSIS — M542 Cervicalgia: Secondary | ICD-10-CM | POA: Diagnosis not present

## 2021-08-29 DIAGNOSIS — Z79891 Long term (current) use of opiate analgesic: Secondary | ICD-10-CM | POA: Diagnosis not present

## 2021-11-26 DIAGNOSIS — E6609 Other obesity due to excess calories: Secondary | ICD-10-CM | POA: Diagnosis not present

## 2021-11-26 DIAGNOSIS — I10 Essential (primary) hypertension: Secondary | ICD-10-CM | POA: Diagnosis not present

## 2021-11-26 DIAGNOSIS — M542 Cervicalgia: Secondary | ICD-10-CM | POA: Diagnosis not present

## 2021-11-26 DIAGNOSIS — K5900 Constipation, unspecified: Secondary | ICD-10-CM | POA: Diagnosis not present

## 2022-04-10 DIAGNOSIS — I10 Essential (primary) hypertension: Secondary | ICD-10-CM | POA: Diagnosis not present

## 2022-04-10 DIAGNOSIS — M542 Cervicalgia: Secondary | ICD-10-CM | POA: Diagnosis not present

## 2022-04-10 DIAGNOSIS — E1165 Type 2 diabetes mellitus with hyperglycemia: Secondary | ICD-10-CM | POA: Diagnosis not present

## 2022-04-10 DIAGNOSIS — Z79891 Long term (current) use of opiate analgesic: Secondary | ICD-10-CM | POA: Diagnosis not present

## 2022-07-09 DIAGNOSIS — E669 Obesity, unspecified: Secondary | ICD-10-CM | POA: Diagnosis not present

## 2022-07-09 DIAGNOSIS — E1165 Type 2 diabetes mellitus with hyperglycemia: Secondary | ICD-10-CM | POA: Diagnosis not present

## 2022-07-09 DIAGNOSIS — I10 Essential (primary) hypertension: Secondary | ICD-10-CM | POA: Diagnosis not present

## 2022-07-09 DIAGNOSIS — J449 Chronic obstructive pulmonary disease, unspecified: Secondary | ICD-10-CM | POA: Diagnosis not present

## 2022-09-16 DIAGNOSIS — E1165 Type 2 diabetes mellitus with hyperglycemia: Secondary | ICD-10-CM | POA: Diagnosis not present

## 2022-09-16 DIAGNOSIS — M542 Cervicalgia: Secondary | ICD-10-CM | POA: Diagnosis not present

## 2022-09-16 DIAGNOSIS — Z79891 Long term (current) use of opiate analgesic: Secondary | ICD-10-CM | POA: Diagnosis not present

## 2023-01-13 ENCOUNTER — Encounter (INDEPENDENT_AMBULATORY_CARE_PROVIDER_SITE_OTHER): Payer: Self-pay | Admitting: *Deleted

## 2023-02-21 DIAGNOSIS — D3131 Benign neoplasm of right choroid: Secondary | ICD-10-CM | POA: Diagnosis not present

## 2023-03-28 DIAGNOSIS — E785 Hyperlipidemia, unspecified: Secondary | ICD-10-CM | POA: Diagnosis not present

## 2023-03-28 DIAGNOSIS — Z1211 Encounter for screening for malignant neoplasm of colon: Secondary | ICD-10-CM | POA: Diagnosis not present

## 2023-03-28 DIAGNOSIS — E1165 Type 2 diabetes mellitus with hyperglycemia: Secondary | ICD-10-CM | POA: Diagnosis not present

## 2023-03-28 DIAGNOSIS — E039 Hypothyroidism, unspecified: Secondary | ICD-10-CM | POA: Diagnosis not present

## 2023-03-28 DIAGNOSIS — Z8719 Personal history of other diseases of the digestive system: Secondary | ICD-10-CM | POA: Diagnosis not present

## 2023-03-28 DIAGNOSIS — F17200 Nicotine dependence, unspecified, uncomplicated: Secondary | ICD-10-CM | POA: Diagnosis not present

## 2023-03-28 DIAGNOSIS — Z7689 Persons encountering health services in other specified circumstances: Secondary | ICD-10-CM | POA: Diagnosis not present

## 2023-03-28 DIAGNOSIS — F17210 Nicotine dependence, cigarettes, uncomplicated: Secondary | ICD-10-CM | POA: Diagnosis not present

## 2023-04-14 ENCOUNTER — Other Ambulatory Visit (HOSPITAL_COMMUNITY): Payer: Self-pay | Admitting: Nurse Practitioner

## 2023-04-14 DIAGNOSIS — Z87891 Personal history of nicotine dependence: Secondary | ICD-10-CM

## 2023-04-30 ENCOUNTER — Encounter: Payer: Self-pay | Admitting: *Deleted

## 2023-06-19 DIAGNOSIS — E1165 Type 2 diabetes mellitus with hyperglycemia: Secondary | ICD-10-CM | POA: Diagnosis not present

## 2023-06-19 DIAGNOSIS — E039 Hypothyroidism, unspecified: Secondary | ICD-10-CM | POA: Diagnosis not present

## 2023-06-19 DIAGNOSIS — R7301 Impaired fasting glucose: Secondary | ICD-10-CM | POA: Diagnosis not present

## 2023-07-23 DIAGNOSIS — E782 Mixed hyperlipidemia: Secondary | ICD-10-CM | POA: Diagnosis not present

## 2023-07-23 DIAGNOSIS — G8929 Other chronic pain: Secondary | ICD-10-CM | POA: Diagnosis not present

## 2023-07-23 DIAGNOSIS — R7303 Prediabetes: Secondary | ICD-10-CM | POA: Diagnosis not present

## 2023-07-23 DIAGNOSIS — E039 Hypothyroidism, unspecified: Secondary | ICD-10-CM | POA: Diagnosis not present

## 2023-07-23 DIAGNOSIS — Z202 Contact with and (suspected) exposure to infections with a predominantly sexual mode of transmission: Secondary | ICD-10-CM | POA: Diagnosis not present

## 2023-07-23 DIAGNOSIS — F411 Generalized anxiety disorder: Secondary | ICD-10-CM | POA: Diagnosis not present

## 2023-10-21 DIAGNOSIS — E782 Mixed hyperlipidemia: Secondary | ICD-10-CM | POA: Diagnosis not present

## 2023-10-31 ENCOUNTER — Encounter (INDEPENDENT_AMBULATORY_CARE_PROVIDER_SITE_OTHER): Payer: Self-pay | Admitting: *Deleted

## 2023-11-25 ENCOUNTER — Other Ambulatory Visit (HOSPITAL_COMMUNITY): Payer: Self-pay | Admitting: Nurse Practitioner

## 2023-11-25 DIAGNOSIS — Z122 Encounter for screening for malignant neoplasm of respiratory organs: Secondary | ICD-10-CM

## 2023-11-25 DIAGNOSIS — E782 Mixed hyperlipidemia: Secondary | ICD-10-CM | POA: Diagnosis not present

## 2023-11-25 DIAGNOSIS — E039 Hypothyroidism, unspecified: Secondary | ICD-10-CM

## 2023-11-25 DIAGNOSIS — F411 Generalized anxiety disorder: Secondary | ICD-10-CM | POA: Diagnosis not present

## 2023-11-25 DIAGNOSIS — E1139 Type 2 diabetes mellitus with other diabetic ophthalmic complication: Secondary | ICD-10-CM | POA: Diagnosis not present

## 2023-11-25 DIAGNOSIS — F17210 Nicotine dependence, cigarettes, uncomplicated: Secondary | ICD-10-CM | POA: Diagnosis not present

## 2023-11-26 ENCOUNTER — Other Ambulatory Visit (HOSPITAL_COMMUNITY): Payer: Self-pay | Admitting: Nurse Practitioner

## 2023-11-26 DIAGNOSIS — R011 Cardiac murmur, unspecified: Secondary | ICD-10-CM

## 2023-11-27 ENCOUNTER — Encounter (INDEPENDENT_AMBULATORY_CARE_PROVIDER_SITE_OTHER): Payer: Self-pay | Admitting: *Deleted

## 2023-12-01 ENCOUNTER — Other Ambulatory Visit (HOSPITAL_COMMUNITY): Payer: Self-pay | Admitting: Nurse Practitioner

## 2023-12-01 DIAGNOSIS — Z122 Encounter for screening for malignant neoplasm of respiratory organs: Secondary | ICD-10-CM

## 2023-12-18 ENCOUNTER — Ambulatory Visit (HOSPITAL_COMMUNITY)
Admission: RE | Admit: 2023-12-18 | Discharge: 2023-12-18 | Disposition: A | Discharge status: Home / Self Care | Source: Ambulatory Visit | Attending: Nurse Practitioner | Admitting: Nurse Practitioner

## 2023-12-18 ENCOUNTER — Ambulatory Visit (HOSPITAL_COMMUNITY)
Admission: RE | Admit: 2023-12-18 | Discharge: 2023-12-18 | Disposition: A | Discharge status: Home / Self Care | Payer: Self-pay | Source: Ambulatory Visit | Attending: Nurse Practitioner | Admitting: Nurse Practitioner

## 2023-12-18 DIAGNOSIS — E039 Hypothyroidism, unspecified: Secondary | ICD-10-CM | POA: Insufficient documentation

## 2023-12-18 DIAGNOSIS — Z122 Encounter for screening for malignant neoplasm of respiratory organs: Secondary | ICD-10-CM | POA: Diagnosis not present

## 2023-12-18 DIAGNOSIS — F17210 Nicotine dependence, cigarettes, uncomplicated: Secondary | ICD-10-CM | POA: Diagnosis not present

## 2023-12-18 DIAGNOSIS — E041 Nontoxic single thyroid nodule: Secondary | ICD-10-CM | POA: Diagnosis not present

## 2023-12-23 DIAGNOSIS — R011 Cardiac murmur, unspecified: Secondary | ICD-10-CM | POA: Diagnosis not present

## 2023-12-23 DIAGNOSIS — F17200 Nicotine dependence, unspecified, uncomplicated: Secondary | ICD-10-CM | POA: Diagnosis not present

## 2023-12-23 DIAGNOSIS — E039 Hypothyroidism, unspecified: Secondary | ICD-10-CM | POA: Diagnosis not present

## 2023-12-23 DIAGNOSIS — G8929 Other chronic pain: Secondary | ICD-10-CM | POA: Diagnosis not present

## 2023-12-23 DIAGNOSIS — I10 Essential (primary) hypertension: Secondary | ICD-10-CM | POA: Diagnosis not present

## 2023-12-23 DIAGNOSIS — F17210 Nicotine dependence, cigarettes, uncomplicated: Secondary | ICD-10-CM | POA: Diagnosis not present

## 2023-12-30 ENCOUNTER — Ambulatory Visit (HOSPITAL_COMMUNITY)
Admission: RE | Admit: 2023-12-30 | Discharge: 2023-12-30 | Disposition: A | Discharge status: Home / Self Care | Source: Ambulatory Visit | Attending: Nurse Practitioner | Admitting: Nurse Practitioner

## 2023-12-30 DIAGNOSIS — R011 Cardiac murmur, unspecified: Secondary | ICD-10-CM | POA: Insufficient documentation

## 2023-12-30 LAB — ECHOCARDIOGRAM COMPLETE
AR max vel: 1.91 cm2
AV Area VTI: 1.72 cm2
AV Area mean vel: 1.76 cm2
AV Mean grad: 9 mmHg
AV Peak grad: 16.8 mmHg
Ao pk vel: 2.05 m/s
Area-P 1/2: 2.69 cm2
S' Lateral: 3.2 cm

## 2023-12-30 NOTE — Progress Notes (Signed)
*  PRELIMINARY RESULTS* Echocardiogram 2D Echocardiogram has been performed.  [name] 12/30/2023, 9:23 AM
